# Patient Record
Sex: Male | Born: 1990 | Race: White | Hispanic: No | Marital: Married | Smoking: Never smoker
Health system: Southern US, Community
[De-identification: ages and names within clinical notes are randomized; demographics above are authoritative.]

## PROBLEM LIST (undated history)

## (undated) DIAGNOSIS — I10 Essential (primary) hypertension: Secondary | ICD-10-CM

## (undated) DIAGNOSIS — D689 Coagulation defect, unspecified: Secondary | ICD-10-CM

## (undated) DIAGNOSIS — L03119 Cellulitis of unspecified part of limb: Secondary | ICD-10-CM

## (undated) DIAGNOSIS — I2699 Other pulmonary embolism without acute cor pulmonale: Secondary | ICD-10-CM

## (undated) HISTORY — DX: Coagulation defect, unspecified: D68.9

## (undated) HISTORY — PX: MOUTH SURGERY: SHX715

## (undated) HISTORY — DX: Cellulitis of unspecified part of limb: L03.119

## (undated) HISTORY — DX: Essential (primary) hypertension: I10

## (undated) HISTORY — DX: Other pulmonary embolism without acute cor pulmonale: I26.99

---
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
## 2011-02-03 ENCOUNTER — Ambulatory Visit (INDEPENDENT_AMBULATORY_CARE_PROVIDER_SITE_OTHER): Payer: BC Managed Care – PPO | Admitting: Internal Medicine

## 2011-02-03 ENCOUNTER — Encounter: Payer: Self-pay | Admitting: Internal Medicine

## 2011-02-03 DIAGNOSIS — Z0000 Encounter for general adult medical examination without abnormal findings: Secondary | ICD-10-CM

## 2011-02-03 DIAGNOSIS — Z23 Encounter for immunization: Secondary | ICD-10-CM

## 2011-02-03 NOTE — Progress Notes (Signed)
  Subjective:    Patient ID: Daniel Sweeney, male    DOB: 1990-04-13, 20 y.o.   MRN: [id number]  HPI 20YO male presents for annual exam. No complaints today. Feeling well. Exercises regularly playing basketball. Eats a relatively healthy diet. Had PE last year, but completed 6 months coumadin, and has had no further leg pain/swelling/dyspnea.  No outpatient encounter prescriptions on file as of 02/03/2011.    Review of Systems  Constitutional: Negative for fever, chills, activity change, appetite change, fatigue and unexpected weight change.  Eyes: Negative for visual disturbance.  Respiratory: Negative for cough and shortness of breath.   Cardiovascular: Negative for chest pain, palpitations and leg swelling.  Gastrointestinal: Negative for abdominal pain and abdominal distention.  Genitourinary: Negative for dysuria, urgency and difficulty urinating.  Musculoskeletal: Negative for arthralgias and gait problem.  Skin: Negative for color change and rash.  Hematological: Negative for adenopathy.  Psychiatric/Behavioral: Negative for sleep disturbance and dysphoric mood. The patient is not nervous/anxious.    BP 122/70  Pulse 77  Temp(Src) 98 F (36.7 C) (Oral)  Ht 5\' 9"  (1.753 m)  Wt 171 lb (77.565 kg)  BMI 25.25 kg/m2  SpO2 97%     Objective:   Physical Exam  Constitutional: He is oriented to person, place, and time. He appears well-developed and well-nourished. No distress.  HENT:  Head: Normocephalic and atraumatic.  Right Ear: External ear normal.  Left Ear: External ear normal.  Nose: Nose normal.  Mouth/Throat: Oropharynx is clear and moist. No oropharyngeal exudate.  Eyes: Conjunctivae and EOM are normal. Pupils are equal, round, and reactive to light. Right eye exhibits no discharge. Left eye exhibits no discharge. No scleral icterus.  Neck: Normal range of motion. Neck supple. No tracheal deviation present. No thyromegaly present.  Cardiovascular: Normal rate, regular  rhythm and normal heart sounds.  Exam reveals no gallop and no friction rub.   No murmur heard. Pulmonary/Chest: Effort normal and breath sounds normal. No respiratory distress. He has no wheezes. He has no rales. He exhibits no tenderness.  Abdominal: Soft. Bowel sounds are normal. He exhibits no distension and no mass. There is no tenderness. There is no rebound and no guarding.  Musculoskeletal: Normal range of motion. He exhibits no edema.  Lymphadenopathy:    He has no cervical adenopathy.  Neurological: He is alert and oriented to person, place, and time. No cranial nerve deficit. Coordination normal.  Skin: Skin is warm and dry. No rash noted. He is not diaphoretic. No erythema. No pallor.  Psychiatric: He has a normal mood and affect. His behavior is normal. Judgment and thought content normal.          Assessment & Plan:  1. General exam - Doing well. Flu shot today. Encouraged healthy diet and regular exercise. Will request labs from last year, plan to repeat q2year and prn. Follow up 1 year.

## 2012-02-29 ENCOUNTER — Ambulatory Visit: Payer: BC Managed Care – PPO

## 2012-03-01 ENCOUNTER — Ambulatory Visit (INDEPENDENT_AMBULATORY_CARE_PROVIDER_SITE_OTHER): Payer: BC Managed Care – PPO

## 2012-03-01 DIAGNOSIS — Z23 Encounter for immunization: Secondary | ICD-10-CM

## 2012-04-24 IMAGING — CT CT CHEST W/ CM
1 series · 15 of 32 positions shown, 19 images · IV contrast (APPLIED)
Comparison: none

REASON FOR EXAM: Post-op, shortness of breath, elevated d-dimer, 98MWCW
COMMENTS:

PROCEDURE:     CT  - CT CHEST (FOR PE) W  - September 11, 2009  [DATE]
RESULT:     Chest CT dated 09/11/2009.
TECHNIQUE: Helical 3 mm sections obtained from the thoracic inlet through
the lung bases teslas intravenous administration of 100 mL of Isovue 370.

[Series 5: soft tissue · axial · 0.71mm/px · z∈[-340,-58]mm · 15 of 106 slices shown, 19 images]
[im 8/106  mediastinal]
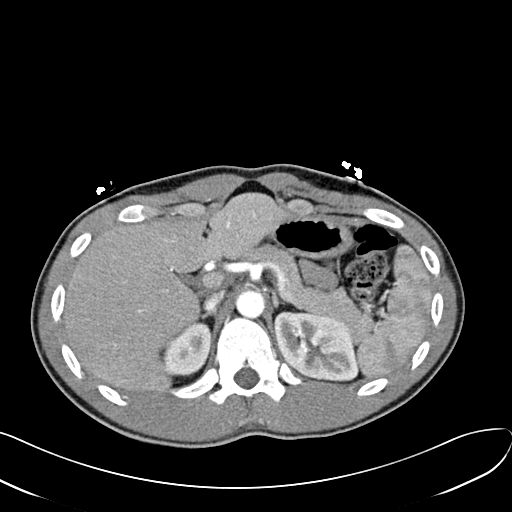
[im 8/106  lung]
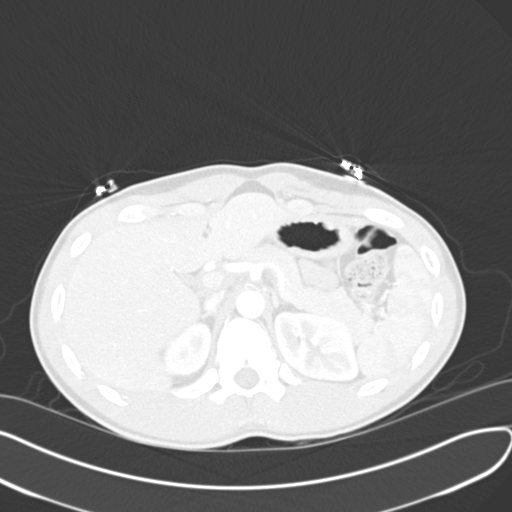
[im 16/106  lung]
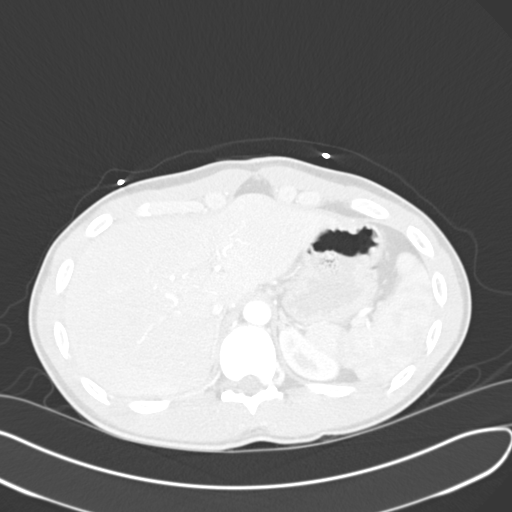
[im 22/106  lung]
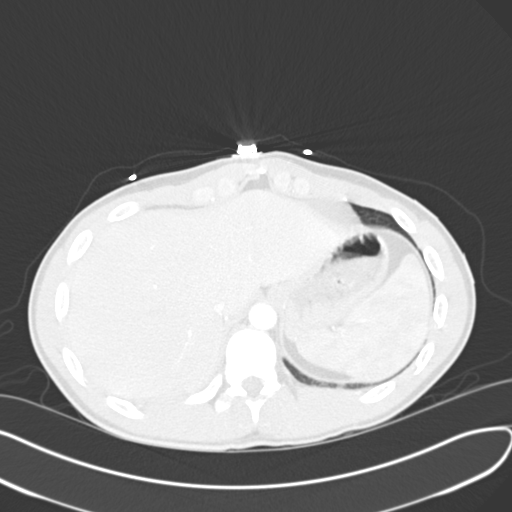
[im 28/106  lung]
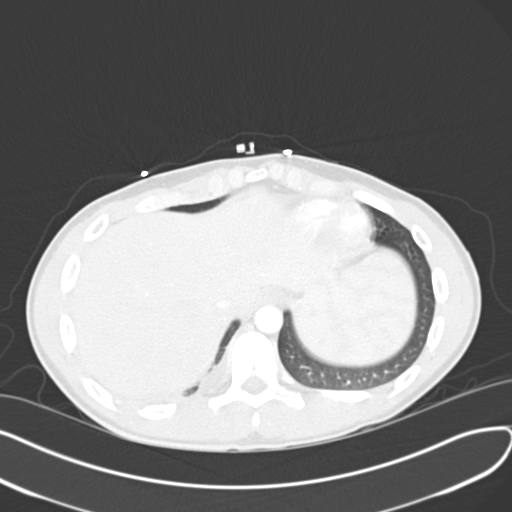
[im 36/106  mediastinal]
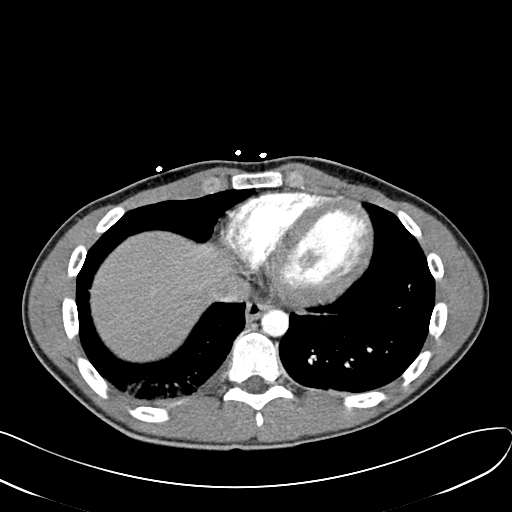
[im 36/106  lung]
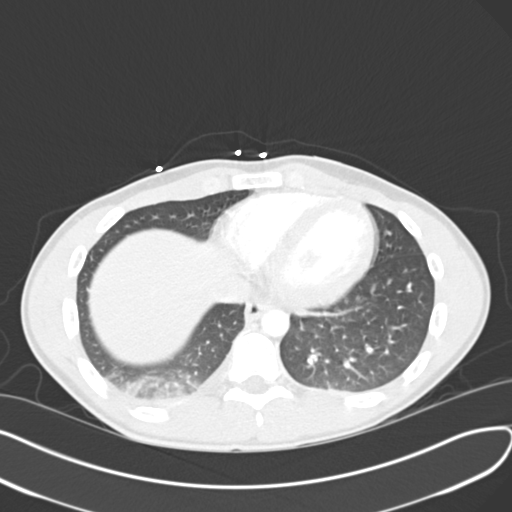
[im 43/106  lung]
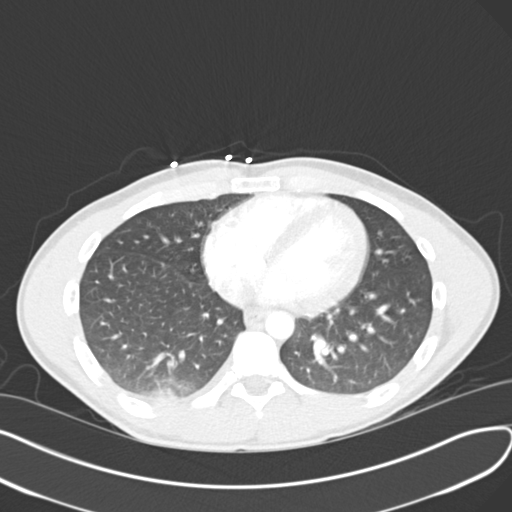
[im 51/106  lung]
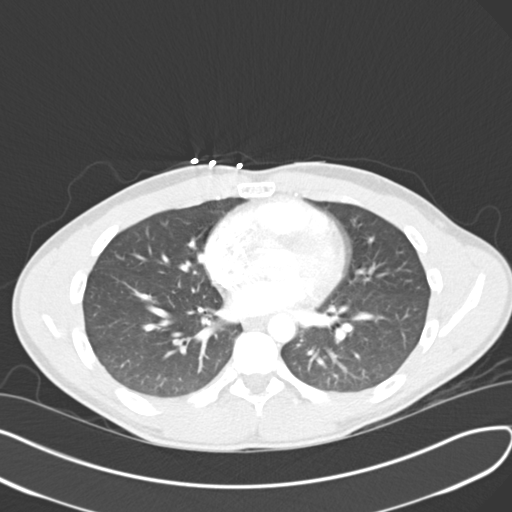
[im 56/106  lung]
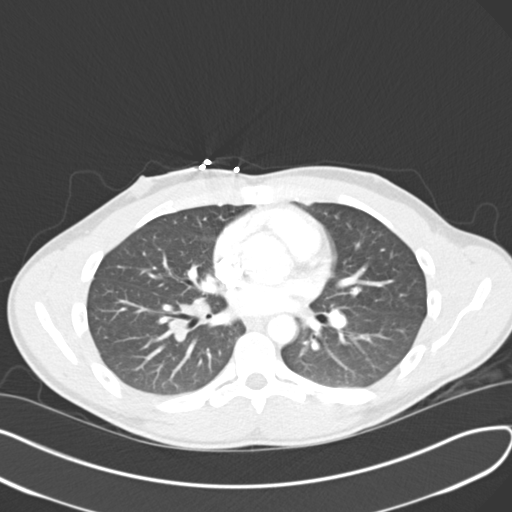
[im 63/106  mediastinal]
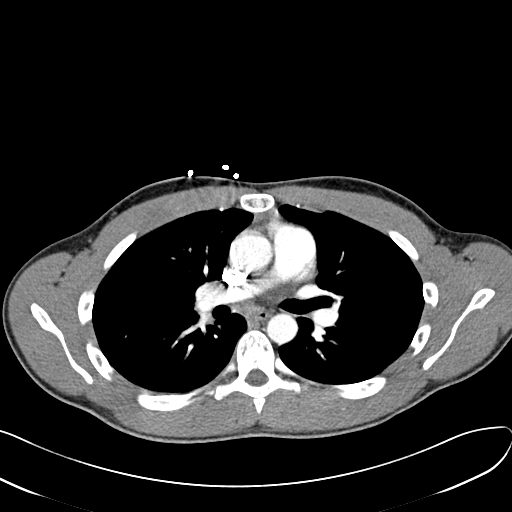
[im 63/106  lung]
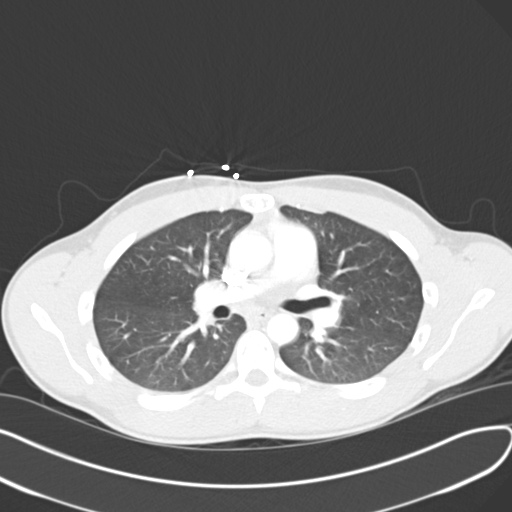
[im 67/106  lung]
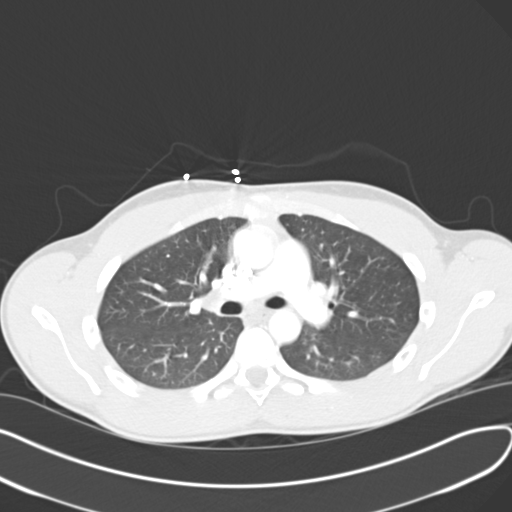
[im 74/106  lung]
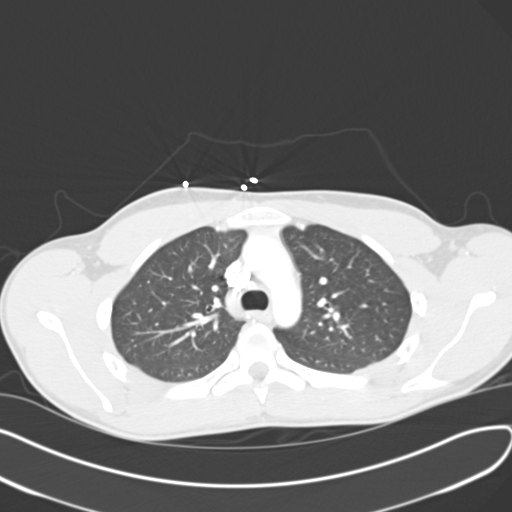
[im 82/106  lung]
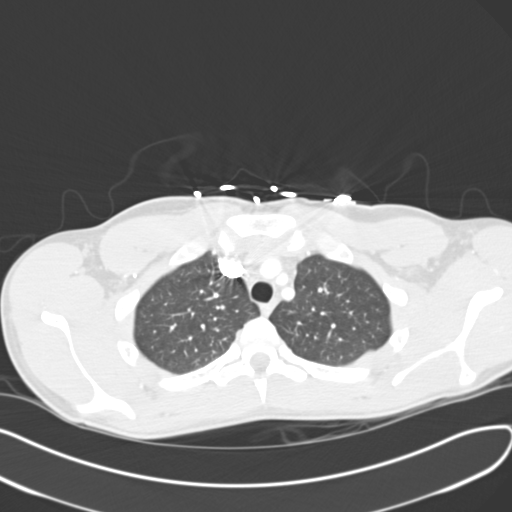
[im 86/106  mediastinal]
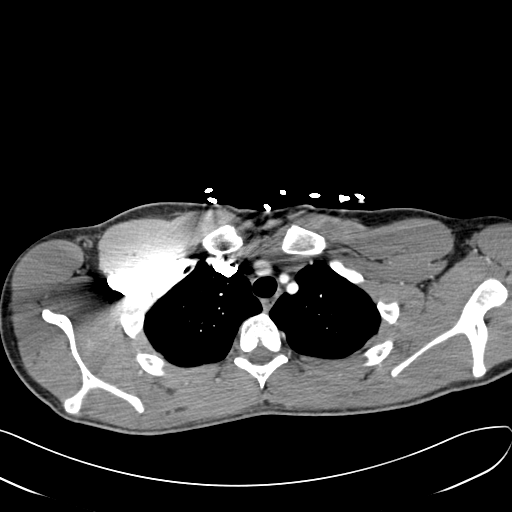
[im 86/106  lung]
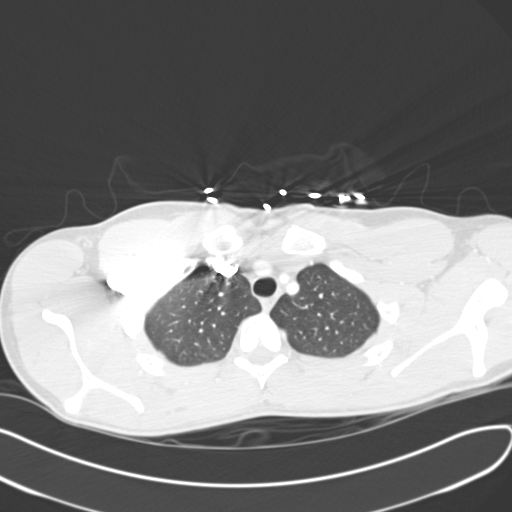
[im 94/106  lung]
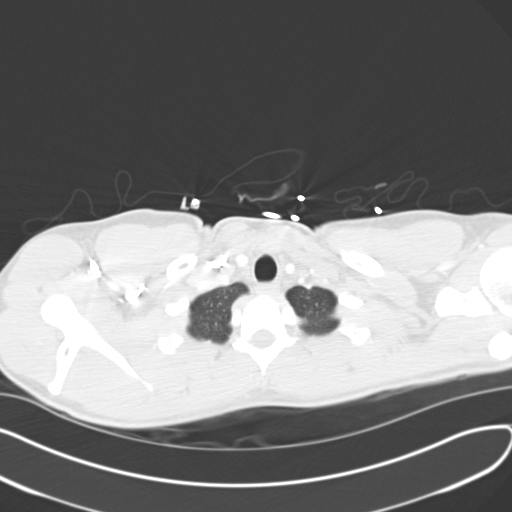
[im 102/106  lung]
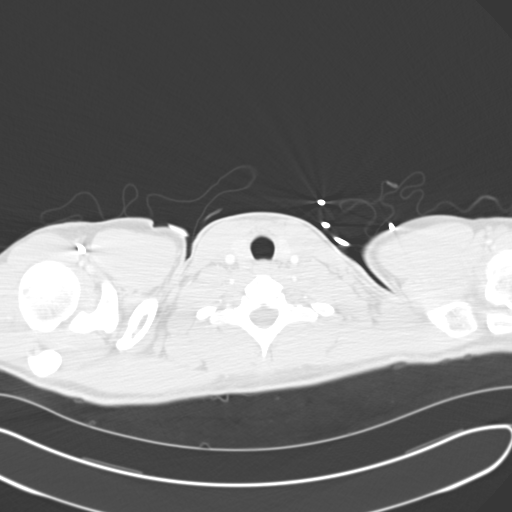

[15 of 32 positions shown; findings below may reference images not displayed]

FINDINGS: A filling defect is appreciated within the right lower lobe
pulmonary artery. There is extension into segmental branches of the
pulmonary arteries of the right lower lobe. A filling defect is also
appreciated a proximal  segmental branch of the right upper lobe pulmonary
artery. Subsegmental filling defects are identified within the basal
segments of the right lower lobe pulmonary arterial system. No further
filling defects are appreciated within the main, lobar or segmental
pulmonary arteries.

Evaluation of mediastinal or hilar regions and structures and trace no
evidence of mediastinal or hilar masses or adenopathy. The lung parenchyma
demonstrates an ill-defined area of pleural based increased density in the
right lung base. Differential considerations are atelectasis versus
infiltrate versus possibly a region of mild infarction. A trace right
pleural effusion is identified. The lung parenchyma otherwise demonstrates
no further regions of focal infiltrates effusions or edema. The visualized
upper abdominal viscera demonstrate no gross abnormalities.
IMPRESSION: CT findings consistent with pulmonary arterial embolic
disease involving the right lower lobe pulmonary artery as well as segmental
and subsegmental branches.
2. Dr. Jim of the emergency department was informed of these findings
of the time of the initial interpretation.

## 2012-08-28 IMAGING — US US EXTREM LOW VENOUS BILAT
1 series · 17 of 24 positions shown · non-contrast
Comparison: none

REASON FOR EXAM: CR  [id number] Yuli cellulitis rt thigh eval both legs
for DVT
COMMENTS:

[Series 1: us extrem low venous bilat · 17 of 47 slices shown]
[im 1/47]
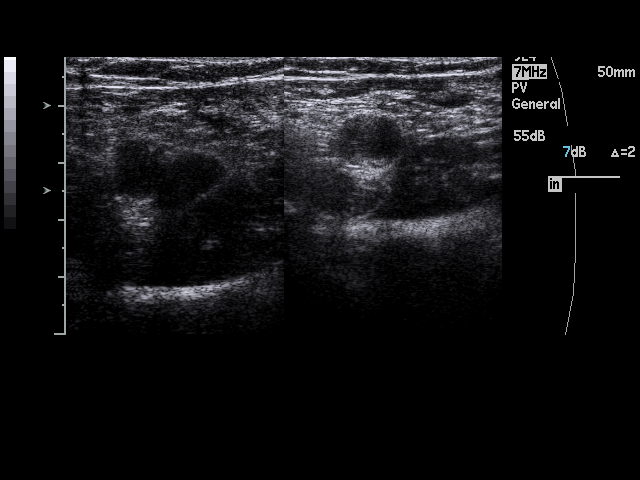
[im 5/47]
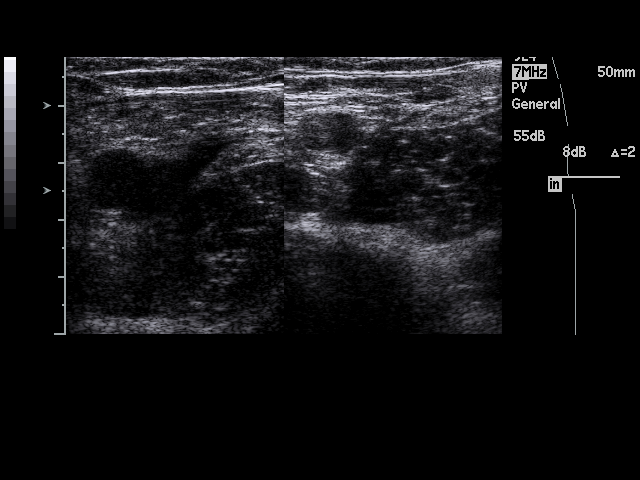
[im 7/47]
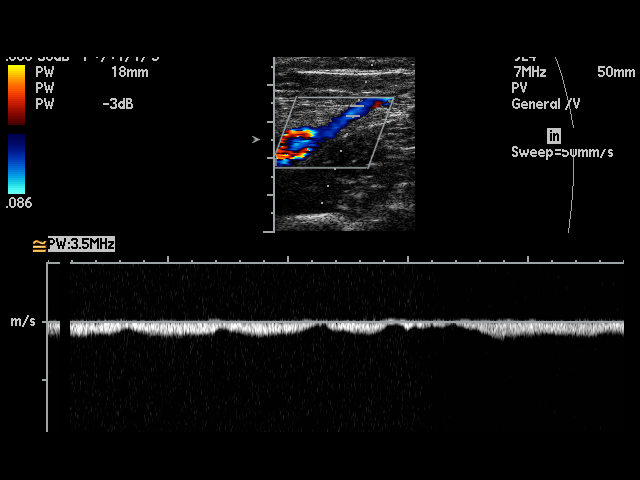
[im 9/47]
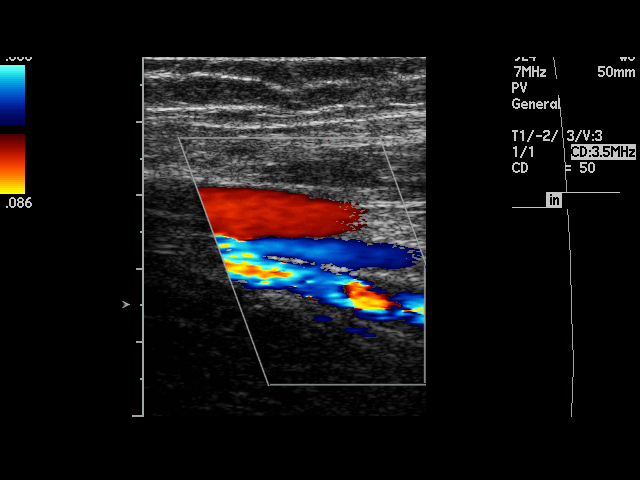
[im 13/47]
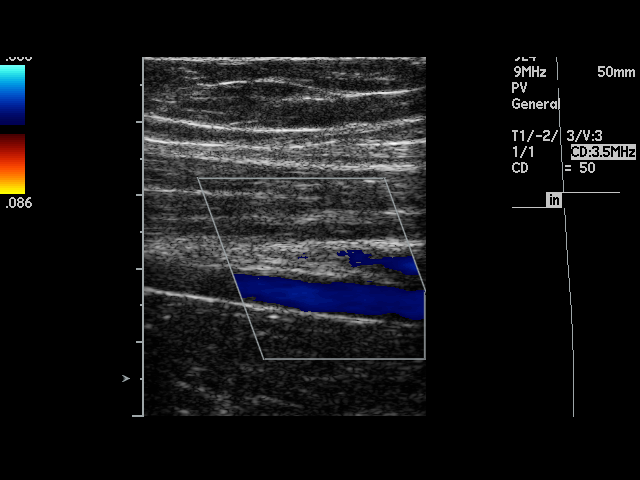
[im 15/47]
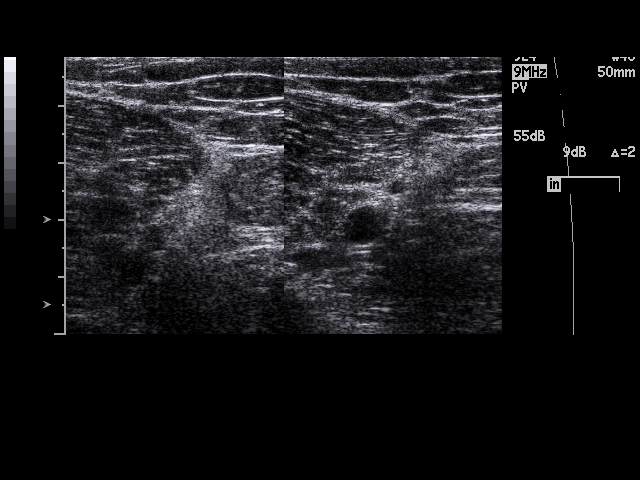
[im 19/47]
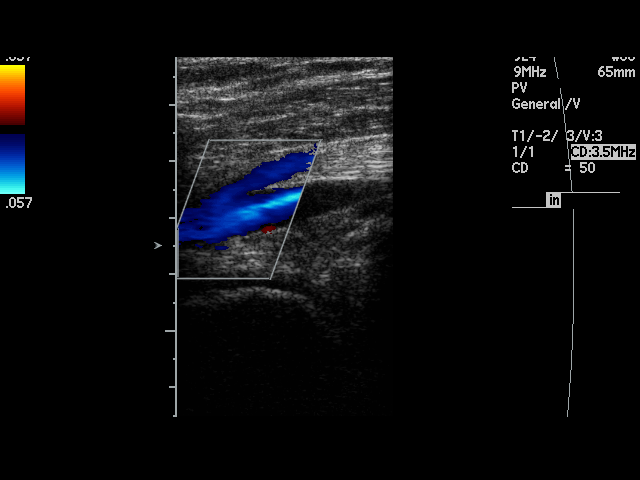
[im 21/47]
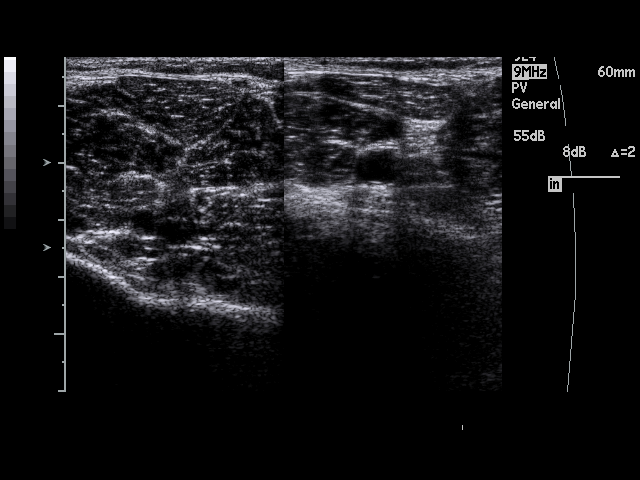
[im 25/47]
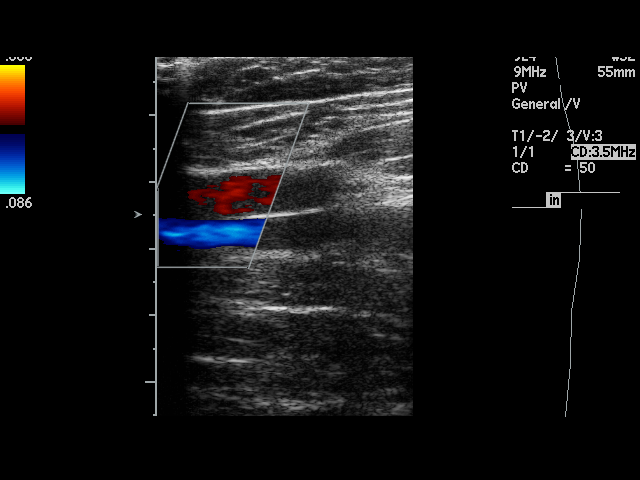
[im 27/47]
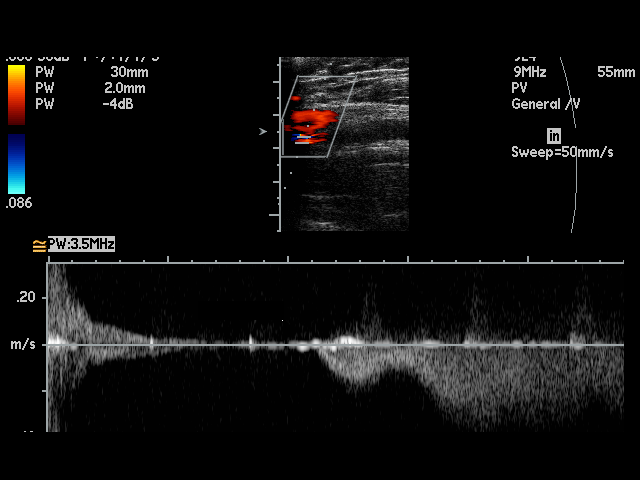
[im 29/47]
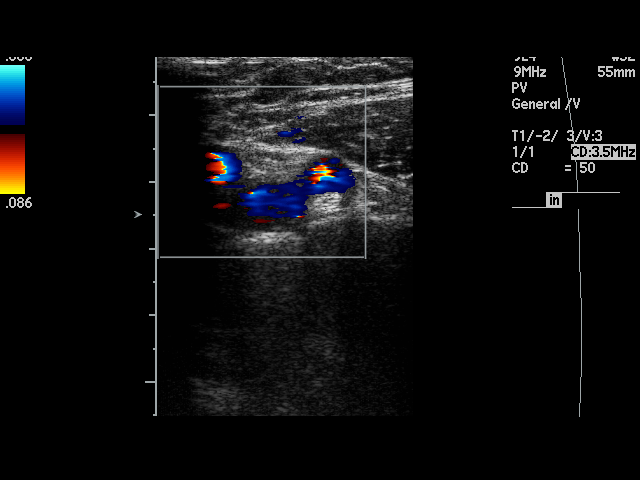
[im 33/47]
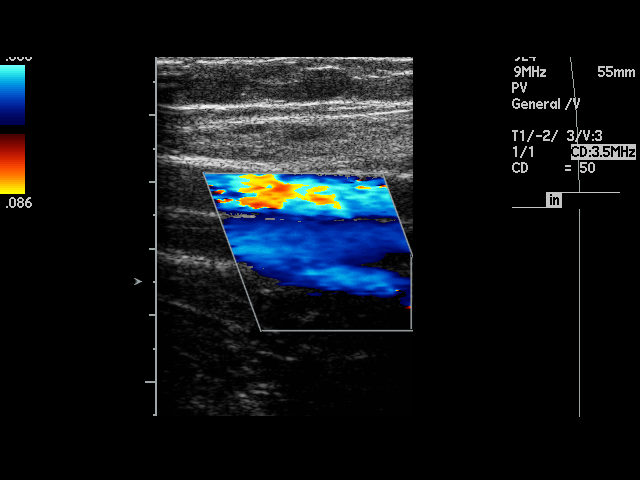
[im 35/47]
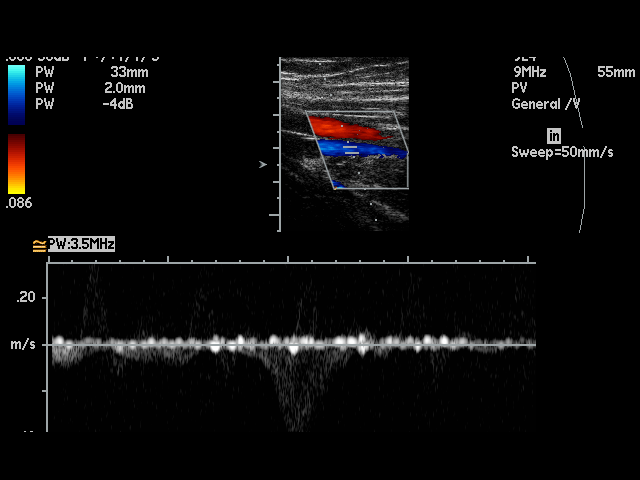
[im 39/47]
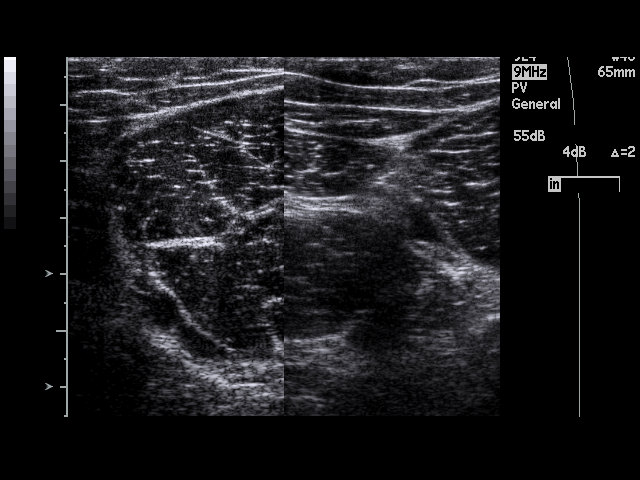
[im 41/47]
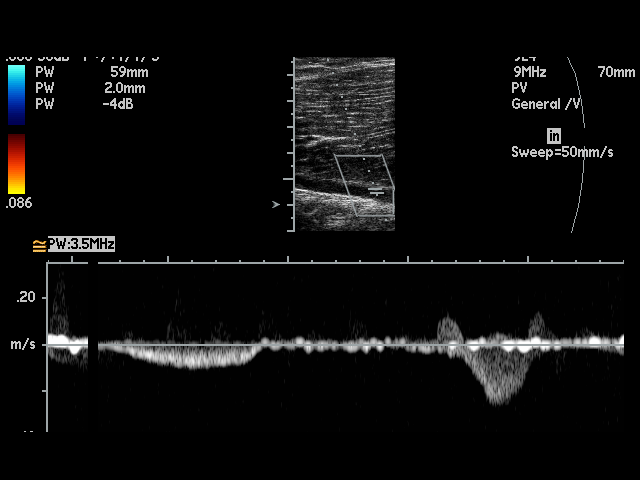
[im 43/47]
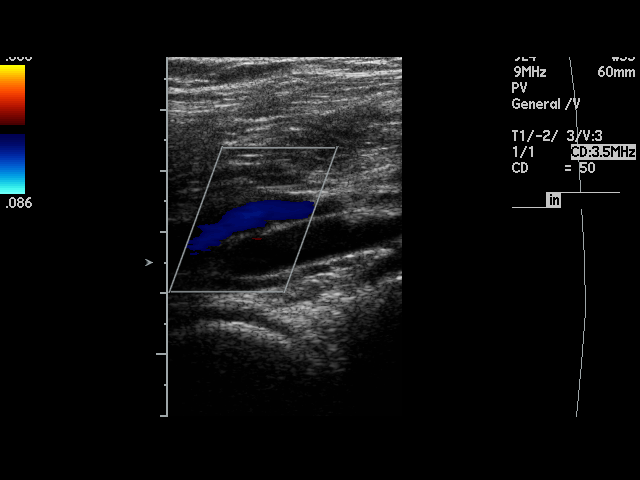
[im 47/47]
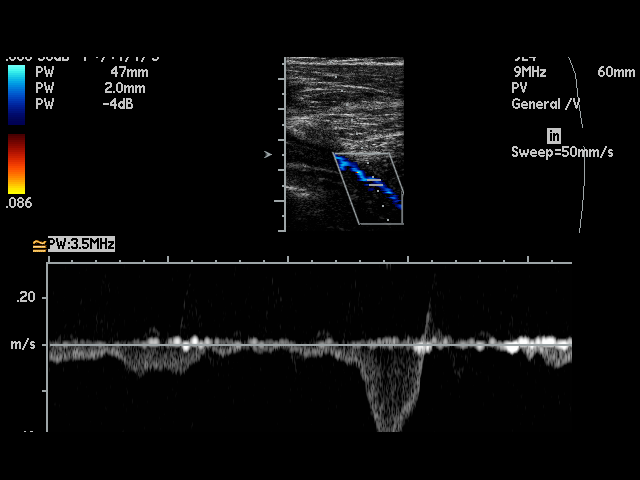

[17 of 24 positions shown; findings below may reference images not displayed]

PROCEDURE:     TARLA - TARLA DOPPLER VEINS BIL LEG EXTR  - January 15, 2010  [DATE]

RESULT:     The right and left femoral and popliteal veins are normally
compressible. The waveform patterns are normal and the color flow images are
normal. The response to the augmentation and Valsalva maneuvers is normal.
IMPRESSION: I do not see evidence of thrombosis of the right or left
femoral or popliteal veins.

A preliminary report was called the patient's caregiver at the conclusion of
the study.

## 2012-09-14 ENCOUNTER — Telehealth: Payer: Self-pay | Admitting: Internal Medicine

## 2012-09-14 NOTE — Telephone Encounter (Signed)
FYI to Dr. Walker 

## 2012-09-14 NOTE — Telephone Encounter (Signed)
Patient Information:  Caller Name: Daniel Sweeney  Phone: [phone]  Patient: Daniel Sweeney, Daniel Sweeney  Gender: Male  DOB: [birth year]  Age: 22 Years  PCP: Ronna Polio (Adults only)  Office Follow Up:  Does the office need to follow up with this patient?: No  Instructions For The Office: N/A  RN Note:  Instructed to monitor and call back if sx worsen or increase; will comply  Symptoms  Reason For Call & Symptoms: Dad is calling and states that child has yellow green discharge from left eye; sx started 09/11/12; sx today is only small amount of yellow discharged noted in cormer of eye upon awakening; no redness; no swelling; no irritation;  Reviewed Health History In EMR: Yes  Reviewed Medications In EMR: Yes  Reviewed Allergies In EMR: Yes  Reviewed Surgeries / Procedures: Yes  Date of Onset of Symptoms: 09/11/2012  Treatments Tried: allergy drops and warm compresses  Treatments Tried Worked: No  Guideline(s) Used:  Eye - Pus or Discharge  Disposition Per Guideline:   Home Care  Reason For Disposition Reached:   Very small amount of discharge and only in corner of eye  Advice Given:  Reassurance:  A small amount of pus or mucus in the inner corner of the eye is often due to a cold or virus. Sometimes it's just a reaction to an irritant in the eye.  Viral conjunctivitis (pink eye) does not need antibiotic treatment. It gets better by itself. Usually it's gone in 2 or 3 days.  Call Back If  Pus increases in amount  Pus in corner of eye lasts over 3 days  Eyelid becomes red or swollen  You become worse.  Patient Will Follow Care Advice:  YES

## 2013-04-24 ENCOUNTER — Ambulatory Visit: Payer: BC Managed Care – PPO | Admitting: Adult Health

## 2013-11-14 ENCOUNTER — Emergency Department: Payer: Self-pay | Admitting: Emergency Medicine

## 2013-11-19 ENCOUNTER — Other Ambulatory Visit: Payer: Self-pay | Admitting: Internal Medicine

## 2013-12-03 ENCOUNTER — Telehealth: Payer: Self-pay | Admitting: Internal Medicine

## 2013-12-03 ENCOUNTER — Ambulatory Visit (INDEPENDENT_AMBULATORY_CARE_PROVIDER_SITE_OTHER): Payer: BC Managed Care – PPO | Admitting: Internal Medicine

## 2013-12-03 ENCOUNTER — Encounter: Payer: Self-pay | Admitting: Internal Medicine

## 2013-12-03 VITALS — BP 144/88 | HR 112 | Temp 97.7°F | Ht 69.0 in | Wt 189.2 lb

## 2013-12-03 DIAGNOSIS — IMO0001 Reserved for inherently not codable concepts without codable children: Secondary | ICD-10-CM | POA: Insufficient documentation

## 2013-12-03 DIAGNOSIS — R030 Elevated blood-pressure reading, without diagnosis of hypertension: Secondary | ICD-10-CM

## 2013-12-03 DIAGNOSIS — S82009A Unspecified fracture of unspecified patella, initial encounter for closed fracture: Secondary | ICD-10-CM | POA: Insufficient documentation

## 2013-12-03 DIAGNOSIS — R000 Tachycardia, unspecified: Secondary | ICD-10-CM | POA: Insufficient documentation

## 2013-12-03 DIAGNOSIS — S82002S Unspecified fracture of left patella, sequela: Secondary | ICD-10-CM

## 2013-12-03 LAB — COMPREHENSIVE METABOLIC PANEL
ALT: 39 U/L (ref 0–53)
AST: 23 U/L (ref 0–37)
Albumin: 4 g/dL (ref 3.5–5.2)
Alkaline Phosphatase: 69 U/L (ref 39–117)
BUN: 17 mg/dL (ref 6–23)
CALCIUM: 9.9 mg/dL (ref 8.4–10.5)
CHLORIDE: 102 meq/L (ref 96–112)
CO2: 24 meq/L (ref 19–32)
CREATININE: 1.2 mg/dL (ref 0.4–1.5)
GFR: 82.14 mL/min (ref >60.00)
GLUCOSE: 84 mg/dL (ref 70–99)
Potassium: 4.4 mEq/L (ref 3.5–5.1)
Sodium: 138 mEq/L (ref 135–145)
Total Bilirubin: 0.7 mg/dL (ref 0.2–1.2)
Total Protein: 8.1 g/dL (ref 6.0–8.3)

## 2013-12-03 LAB — CBC WITH DIFFERENTIAL/PLATELET
BASOS PCT: 0.7 % (ref 0.0–3.0)
Basophils Absolute: 0.1 10*3/uL (ref 0.0–0.1)
EOS PCT: 3 % (ref 0.0–5.0)
Eosinophils Absolute: 0.3 10*3/uL (ref 0.0–0.7)
HCT: 49.6 % (ref 39.0–52.0)
Hemoglobin: 16.5 g/dL (ref 13.0–17.0)
LYMPHS PCT: 27.4 % (ref 12.0–46.0)
Lymphs Abs: 2.9 10*3/uL (ref 0.7–4.0)
MCHC: 33.3 g/dL (ref 30.0–36.0)
MCV: 87.8 fl (ref 78.0–100.0)
MONOS PCT: 5.8 % (ref 3.0–12.0)
Monocytes Absolute: 0.6 10*3/uL (ref 0.1–1.0)
NEUTROS PCT: 63.1 % (ref 43.0–77.0)
Neutro Abs: 6.7 10*3/uL (ref 1.4–7.7)
Platelets: 324 10*3/uL (ref 150.0–400.0)
RBC: 5.65 Mil/uL (ref 4.22–5.81)
RDW: 12.8 % (ref 11.5–15.5)
WBC: 10.6 10*3/uL — AB (ref 4.0–10.5)

## 2013-12-03 LAB — TSH: TSH: 1.53 u[IU]/mL (ref 0.35–4.50)

## 2013-12-03 NOTE — Assessment & Plan Note (Signed)
BP Readings from Last 3 Encounters:  12/03/13 144/88  02/03/11 122/70   BP elevated today and at last few visits at [facility]. Will check CMP, CBC, TSH. EKG today suggests LVH. Will get cardiac ECHO. Will hold off on adding medication for now. Recheck BP in 1 week.

## 2013-12-03 NOTE — Telephone Encounter (Signed)
Pt needs 1wk  Recheck 30 mins. Please advise where to add to schedule.msn

## 2013-12-03 NOTE — Patient Instructions (Signed)
Labs today. EKG today.  We will schedule cardiac ECHO.  Follow up in 1-2 weeks.

## 2013-12-03 NOTE — Assessment & Plan Note (Signed)
Tachycardia noted on exam today. EKG sinus tach. Denies pain. S/p recent repair of patellar fracture. On anticoagulation with Xarelto and Lovenox prior to that. Will check CMP, CBC, TSH with labs.

## 2013-12-03 NOTE — Telephone Encounter (Signed)
4pm Tues Nov 3rd 30min

## 2013-12-03 NOTE — Telephone Encounter (Signed)
Please advise 

## 2013-12-03 NOTE — Progress Notes (Signed)
Pre visit review using our clinic review tool, if applicable. No additional management support is needed unless otherwise documented below in the visit note. 

## 2013-12-03 NOTE — Assessment & Plan Note (Addendum)
Continue follow up as scheduled tomorrow at [facility]. Pt denies any pain symptoms.

## 2013-12-03 NOTE — Progress Notes (Signed)
Subjective:    Patient ID: Daniel Sweeney, male    DOB: 04/23/1990, 23 y.o.   MRN: [id number]  HPI 22YO male presents for follow up.  [facility] hospitalized at [facility]ellar fracture. Had surgery to repair patella 2 weeks ago. Started on Xarelto because of previous history of PE.  BP was noted to be elevated during recent admission, as high as 172/100. On recheck at [facility] BP was elevated 149/103. Has follow [facility] at [facility]. No dyspnea, chest pain. Occasional headache last week. Improved with Tylenol. Denies any pain.   Review of Systems  Constitutional: Negative for fever, chills, activity change, appetite change, fatigue and unexpected weight change.  Eyes: Negative for visual disturbance.  Respiratory: Positive for shortness of breath (only with exertion). Negative for cough and wheezing.   Cardiovascular: Negative for chest pain, palpitations and leg swelling.  Gastrointestinal: Negative for abdominal pain and abdominal distention.  Genitourinary: Negative for dysuria, urgency and difficulty urinating.  Musculoskeletal: Negative for arthralgias and gait problem.  Skin: Negative for color change and rash.  Neurological: Positive for headaches.  Hematological: Negative for adenopathy.  Psychiatric/Behavioral: Negative for sleep disturbance and dysphoric mood. The patient is not nervous/anxious.        Objective:    BP 144/88  Pulse 112  Temp(Src) 97.7 F (36.5 C) (Oral)  Ht 5\' 9"  (1.753 m)  Wt 189 lb 4 oz (85.843 kg)  BMI 27.93 kg/m2  SpO2 98% Physical Exam  Constitutional: He is oriented to person, place, and time. He appears well-developed and well-nourished. No distress.  HENT:  Head: Normocephalic and atraumatic.  Right Ear: External ear normal.  Left Ear: External ear normal.  Nose: Nose normal.  Mouth/Throat: Oropharynx is clear and moist.  Eyes: Conjunctivae and EOM are normal. Pupils are equal, round, and reactive to light. Right eye  exhibits no discharge. Left eye exhibits no discharge. No scleral icterus.  Neck: Normal range of motion. Neck supple. No tracheal deviation present. No thyromegaly present.  Cardiovascular: Normal rate, regular rhythm and normal heart sounds.  Exam reveals no gallop and no friction rub.   No murmur heard. Pulmonary/Chest: Effort normal and breath sounds normal. No accessory muscle usage. Not tachypneic. No respiratory distress. He has no decreased breath sounds. He has no wheezes. He has no rhonchi. He has no rales. He exhibits no tenderness.  Musculoskeletal: Normal range of motion. He exhibits no edema.       Legs: Lymphadenopathy:    He has no cervical adenopathy.  Neurological: He is alert and oriented to person, place, and time. No cranial nerve deficit. Coordination normal.  Skin: Skin is warm and dry. No rash noted. He is not diaphoretic. No erythema. No pallor.  Psychiatric: He has a normal mood and affect. His behavior is normal. Judgment and thought content normal.          Assessment & Plan:   Problem List Items Addressed This Visit     Unprioritized   Elevated BP - Primary      BP Readings from Last 3 Encounters:  12/03/13 144/88  02/03/11 122/70   BP elevated today and at [facility]. Will check CMP, CBC, TSH. EKG today suggests LVH. Will get cardiac ECHO. Will hold off on adding medication for now. Recheck BP in 1 week.    Relevant Orders      Comprehensive metabolic panel      TSH      CBC w/Diff  2D Echocardiogram without contrast      EKG 12-Lead (Completed)   Patellar fracture     [facility]. Pt denies any pain symptoms.    Tachycardia     Tachycardia noted on exam today. EKG sinus tach. Denies pain. S/p recent repair of patellar fracture. On anticoagulation with Xarelto and Lovenox prior to that. Will check CMP, CBC, TSH with labs.        Return in about 1 week (around 12/10/2013) for  Recheck.

## 2013-12-04 ENCOUNTER — Other Ambulatory Visit (HOSPITAL_COMMUNITY): Payer: Self-pay | Admitting: Internal Medicine

## 2013-12-04 ENCOUNTER — Ambulatory Visit (HOSPITAL_COMMUNITY): Payer: BC Managed Care – PPO | Attending: Internal Medicine | Admitting: Radiology

## 2013-12-04 ENCOUNTER — Encounter: Payer: Self-pay | Admitting: Internal Medicine

## 2013-12-04 ENCOUNTER — Telehealth: Payer: Self-pay | Admitting: Internal Medicine

## 2013-12-04 DIAGNOSIS — R030 Elevated blood-pressure reading, without diagnosis of hypertension: Principal | ICD-10-CM

## 2013-12-04 DIAGNOSIS — R000 Tachycardia, unspecified: Secondary | ICD-10-CM | POA: Diagnosis not present

## 2013-12-04 DIAGNOSIS — IMO0001 Reserved for inherently not codable concepts without codable children: Secondary | ICD-10-CM

## 2013-12-04 NOTE — Telephone Encounter (Signed)
LMTCB for appt details/msn °

## 2013-12-04 NOTE — Progress Notes (Signed)
Echocardiogram performed.  

## 2013-12-17 ENCOUNTER — Encounter: Payer: Self-pay | Admitting: Internal Medicine

## 2013-12-17 ENCOUNTER — Ambulatory Visit (INDEPENDENT_AMBULATORY_CARE_PROVIDER_SITE_OTHER): Payer: BC Managed Care – PPO | Admitting: *Deleted

## 2013-12-17 ENCOUNTER — Ambulatory Visit (INDEPENDENT_AMBULATORY_CARE_PROVIDER_SITE_OTHER): Payer: BC Managed Care – PPO | Admitting: Internal Medicine

## 2013-12-17 VITALS — BP 116/92 | HR 98 | Temp 98.2°F | Ht 69.0 in | Wt 193.5 lb

## 2013-12-17 DIAGNOSIS — I10 Essential (primary) hypertension: Secondary | ICD-10-CM

## 2013-12-17 DIAGNOSIS — Z23 Encounter for immunization: Secondary | ICD-10-CM

## 2013-12-17 NOTE — Progress Notes (Signed)
Pre visit review using our clinic review tool, if applicable. No additional management support is needed unless otherwise documented below in the visit note. 

## 2013-12-17 NOTE — Progress Notes (Signed)
   Subjective:    Patient ID: Daniel ShropshireJoshua Sweeney, male    DOB: 02/19/1990, 23 y.o.   MRN: [id number]  HPI 23YO male presents for follow up.  S/p left patellar fracture and surgical repair. Continues to wear left leg brace. Continues on Xarelto.  BP have been typically near 130-150/80-90 when checked at home. He denies headache, chest pain, dyspnea, palpitations. Recent ECHO was normal.   Review of Systems  Constitutional: Negative for fever, chills, activity change, appetite change, fatigue and unexpected weight change.  Eyes: Negative for visual disturbance.  Respiratory: Negative for cough and shortness of breath.   Cardiovascular: Negative for chest pain, palpitations and leg swelling.  Gastrointestinal: Negative for abdominal pain and abdominal distention.  Genitourinary: Negative for dysuria, urgency and difficulty urinating.  Musculoskeletal: Negative for arthralgias and gait problem.  Skin: Negative for color change and rash.  Hematological: Negative for adenopathy.  Psychiatric/Behavioral: Negative for sleep disturbance and dysphoric mood. The patient is not nervous/anxious.        Objective:    BP 116/92 mmHg  Pulse 98  Temp(Src) 98.2 F (36.8 C) (Oral)  Ht 5\' 9"  (1.753 m)  Wt 193 lb 8 oz (87.771 kg)  BMI 28.56 kg/m2  SpO2 97% Physical Exam  Constitutional: He is oriented to person, place, and time. He appears well-developed and well-nourished. No distress.  HENT:  Head: Normocephalic and atraumatic.  Right Ear: External ear normal.  Left Ear: External ear normal.  Nose: Nose normal.  Mouth/Throat: Oropharynx is clear and moist. No oropharyngeal exudate.  Eyes: Conjunctivae and EOM are normal. Pupils are equal, round, and reactive to light. Right eye exhibits no discharge. Left eye exhibits no discharge. No scleral icterus.  Neck: Normal range of motion. Neck supple. No tracheal deviation present. No thyromegaly present.  Cardiovascular: Normal rate, regular rhythm  and normal heart sounds.  Exam reveals no gallop and no friction rub.   No murmur heard. Pulmonary/Chest: Effort normal and breath sounds normal. No accessory muscle usage. No tachypnea. No respiratory distress. He has no decreased breath sounds. He has no wheezes. He has no rhonchi. He has no rales. He exhibits no tenderness.  Musculoskeletal: Normal range of motion. He exhibits no edema.       Legs: Lymphadenopathy:    He has no cervical adenopathy.  Neurological: He is alert and oriented to person, place, and time. No cranial nerve deficit. Coordination normal.  Skin: Skin is warm and dry. No rash noted. He is not diaphoretic. No erythema. No pallor.  Psychiatric: He has a normal mood and affect. His behavior is normal. Judgment and thought content normal.          Assessment & Plan:   Problem List Items Addressed This Visit      Unprioritized   Essential hypertension - Primary    BP Readings from Last 3 Encounters:  12/17/13 116/92  12/03/13 144/88  02/03/11 122/70   BP has been elevated after recent left leg injury. Recent labs including renal function, thyroid function were normal and cardiac ECHO was normal. Will set up cardiology evaluation. Question if renal artery dopplers might be warranted for further evaluation.     Relevant Orders      Ambulatory referral to Cardiology       Return in about 4 weeks (around 01/14/2014) for Recheck.

## 2013-12-17 NOTE — Assessment & Plan Note (Signed)
BP Readings from Last 3 Encounters:  12/17/13 116/92  12/03/13 144/88  02/03/11 122/70   BP has been elevated after recent left leg injury. Recent labs including renal function, thyroid function were normal and cardiac ECHO was normal. Will set up cardiology evaluation. Question if renal artery dopplers might be warranted for further evaluation.

## 2013-12-17 NOTE — Patient Instructions (Signed)
We will set up an evaluation with Cardiology.

## 2013-12-18 ENCOUNTER — Telehealth: Payer: Self-pay | Admitting: Internal Medicine

## 2013-12-18 NOTE — Telephone Encounter (Signed)
emmi emailed °

## 2013-12-24 ENCOUNTER — Encounter: Payer: Self-pay | Admitting: Cardiovascular Disease

## 2013-12-24 ENCOUNTER — Ambulatory Visit (INDEPENDENT_AMBULATORY_CARE_PROVIDER_SITE_OTHER): Payer: BC Managed Care – PPO | Admitting: Cardiovascular Disease

## 2013-12-24 VITALS — BP 130/98 | HR 93 | Ht 70.0 in | Wt 189.0 lb

## 2013-12-24 DIAGNOSIS — R000 Tachycardia, unspecified: Secondary | ICD-10-CM

## 2013-12-24 DIAGNOSIS — I10 Essential (primary) hypertension: Secondary | ICD-10-CM

## 2013-12-24 NOTE — Patient Instructions (Signed)
Your physician has requested that you have a renal artery duplex. During this test, an ultrasound is used to evaluate blood flow to the kidneys. Allow one hour for this exam. Do not eat after midnight the day before and avoid carbonated beverages. Take your medications as you usually do.  Your physician recommends that you return for lab work in:  Tomorrow morning or a morning next week  Cortisol and Aldosterone renin ratio   Your physician recommends that you schedule a follow-up appointment in:  As needed   Your next appointment will be scheduled in our new office located at :  [facility]  [address], [address]  [place], [place] [address]

## 2013-12-24 NOTE — Progress Notes (Signed)
Primary care physician: Dr. Dan HumphreysWalker  HPI  This is a pleasant 23 year old man who was referred for evaluation of hypertension. He has no previous cardiac history for congenital heart disease. He did have an injury to his right knee last year which required surgery. It was complicated by right sided DVT. He underwent workup for hypercoagulable state which was negative. He had a recent injury to his left knee which required surgery. He was placed on anticoagulation briefly. Since the injury, he has noted elevated blood pressure without any symptoms. He denies any headache, dizziness, chest pain or shortness of breath. No palpitations. He is currently not taking any medications or supplements. He does not smoke or drink excessive amount of alcohol. He had routine labs recently which were overall unremarkable including TSH. Echocardiogram showed normal LV systolic function but there was evidence of moderate left ventricular hypertrophy.  No Known Allergies   No current outpatient prescriptions on file prior to visit.   No current facility-administered medications on file prior to visit.     Past Medical History  Diagnosis Date  . Pulmonary embolism     in setting of knee surgery, s/p 6 months of coumadin  . Cellulitis of thigh   . Clotting disorder     Hx right lung  . Hypertension      Past Surgical History  Procedure Laterality Date  . Patella fracture surgery  08/2009     Family History  Problem Relation Age of Onset  . Hyperlipidemia Mother   . Hypertension Mother      History   Social History  . Marital Status: Single    Spouse Name: N/A    Number of Children: N/A  . Years of Education: N/A   Occupational History  . Not on file.   Social History Main Topics  . Smoking status: Never Smoker   . Smokeless tobacco: Never Used  . Alcohol Use: No  . Drug Use: No  . Sexual Activity: Not on file   Other Topics Concern  . Not on file   Social History Narrative   Regular Exercise -  Basketball, gym - 2 - 3 times a week   Daily Caffeine Use:  1 soda daily     ROS A 10 point review of system was performed. It is negative other than that mentioned in the history of present illness.   PHYSICAL EXAM   BP 130/98 mmHg  Pulse 93  Ht 5\' 10"  (1.778 m)  Wt 189 lb (85.73 kg)  BMI 27.12 kg/m2 Constitutional: He is oriented to person, place, and time. He appears well-developed and well-nourished. No distress.  HENT: No nasal discharge.  Head: Normocephalic and atraumatic.  Eyes: Pupils are equal and round.  No discharge. Neck: Normal range of motion. Neck supple. No JVD present. No thyromegaly present.  Cardiovascular: Normal rate, regular rhythm, normal heart sounds. Exam reveals no gallop and no friction rub. No murmur heard.  Pulmonary/Chest: Effort normal and breath sounds normal. No stridor. No respiratory distress. He has no wheezes. He has no rales. He exhibits no tenderness.  Abdominal: Soft. Bowel sounds are normal. He exhibits no distension. There is no tenderness. There is no rebound and no guarding. No abdominal bruits Musculoskeletal: Normal range of motion. He exhibits no edema and no tenderness.  Neurological: He is alert and oriented to person, place, and time. Coordination normal.  Skin: Skin is warm and dry. No rash noted. He is not diaphoretic. No erythema. No pallor.  Psychiatric:  He has a normal mood and affect. His behavior is normal. Judgment and thought content normal.       ZOX:WRUEAEKG:Sinus  Rhythm  Voltage criteria for LVH  (R(aVF) exceeds 2.00 mV).   -  Nonspecific T-abnormality.   ABNORMAL    ASSESSMENT AND PLAN

## 2013-12-24 NOTE — Assessment & Plan Note (Addendum)
He does have family history of hypertension but not at this age. Given his young age, we definitely have to exclude secondary hypertension. There is no evidence of coarctation by physical exam. We have to exclude renal artery stenosis due to fibromuscular dysplasia. I requested a renal artery duplex. We also have to evaluate for endocrine issues. I requested a.m. Cortisol level as well as aldosterone/renin ratio. He has no symptoms suggestive of pheochromocytoma.

## 2013-12-25 ENCOUNTER — Other Ambulatory Visit: Payer: BC Managed Care – PPO

## 2013-12-25 DIAGNOSIS — I10 Essential (primary) hypertension: Secondary | ICD-10-CM

## 2013-12-31 ENCOUNTER — Telehealth: Payer: Self-pay | Admitting: Cardiovascular Disease

## 2013-12-31 NOTE — Telephone Encounter (Signed)
Mother calling about results on lab. Please call.

## 2014-01-01 ENCOUNTER — Encounter (INDEPENDENT_AMBULATORY_CARE_PROVIDER_SITE_OTHER): Payer: BC Managed Care – PPO

## 2014-01-01 ENCOUNTER — Telehealth: Payer: Self-pay

## 2014-01-01 DIAGNOSIS — I10 Essential (primary) hypertension: Secondary | ICD-10-CM

## 2014-01-01 DIAGNOSIS — R000 Tachycardia, unspecified: Secondary | ICD-10-CM

## 2014-01-01 NOTE — Telephone Encounter (Signed)
Referral called to [facility]  Notes faxed as requested

## 2014-01-01 NOTE — Telephone Encounter (Signed)
Pt would like lab results.  

## 2014-01-01 NOTE — Telephone Encounter (Signed)
See result note.  

## 2014-01-02 ENCOUNTER — Telehealth: Payer: Self-pay

## 2014-01-02 NOTE — Telephone Encounter (Signed)
Pt mother called, states because he can not drive, pt does not want to go to endo in [place], they want to keep his endo local. States she will call and set up the appt.

## 2014-01-02 NOTE — Telephone Encounter (Signed)
Pt mother called asking us to send a referral [facility]. They need pt ov, labs and any tests he has done with us. Please fax over the referral to [phone]. [facility] to [facility]. Please call mother if you have any questions.

## 2014-01-02 NOTE — Telephone Encounter (Signed)
Referral faxed to [facility] as requested

## 2014-01-08 ENCOUNTER — Telehealth: Payer: Self-pay | Admitting: Cardiovascular Disease

## 2014-01-08 NOTE — Telephone Encounter (Signed)
Pt mother called asking us to send a referral to [facility]. They never got the last fax we sent over. They need pt ov, labs and any tests he has done with us. Please fax over the referral to [phone]. [facility] to [facility]. Please call mother if you have any questions. she is trying to do this before he starts school again.

## 2014-01-08 NOTE — Telephone Encounter (Signed)
Requested info faxed

## 2014-01-16 ENCOUNTER — Telehealth: Payer: Self-pay | Admitting: *Deleted

## 2014-01-16 NOTE — Telephone Encounter (Signed)
Error

## 2014-01-16 NOTE — Telephone Encounter (Signed)
-----   Message from Holley Dexterracy Tekely sent at 01/16/2014  3:51 PM EST ----- Regarding: referral [id number] - Ambulatory referral to Endocrinology. Has this been done?

## 2014-01-31 ENCOUNTER — Encounter: Payer: Self-pay | Admitting: Internal Medicine

## 2014-01-31 ENCOUNTER — Ambulatory Visit (INDEPENDENT_AMBULATORY_CARE_PROVIDER_SITE_OTHER): Payer: BC Managed Care – PPO | Admitting: Internal Medicine

## 2014-01-31 VITALS — BP 150/88 | HR 101 | Temp 98.1°F | Ht 69.0 in | Wt 196.8 lb

## 2014-01-31 DIAGNOSIS — I10 Essential (primary) hypertension: Secondary | ICD-10-CM

## 2014-01-31 MED ORDER — CARVEDILOL 3.125 MG PO TABS: 3.1250 mg | ORAL_TABLET | Freq: Two times a day (BID) | ORAL | Status: DC

## 2014-01-31 NOTE — Assessment & Plan Note (Signed)
Evaluation to date including endocrine evaluation, ECHO and renal artery doppler all normal. BP persistently elevated. Will start Carvedilol 3.125mg  bid. Discussed pros and cons of this medication. He will email with BP readings. Follow up in 4 weeks or sooner as needed.

## 2014-01-31 NOTE — Progress Notes (Signed)
Pre visit review using our clinic review tool, if applicable. No additional management support is needed unless otherwise documented below in the visit note. 

## 2014-01-31 NOTE — Progress Notes (Signed)
Subjective:    Patient ID: Daniel Sweeney, male    DOB: 10/20/1990, 23 y.o.   MRN: [id number]  HPI 23YO male presents for follow up.  Evaluation for hypertension including ECHO, renal artery doppler and endocrine evaluation have been all normal. No chest pain, headache, dyspnea, palpitations. Feeling well.  BP Readings from Last 3 Encounters:  01/31/14 150/88  12/24/13 130/98  12/17/13 116/92    Wt Readings from Last 3 Encounters:  01/31/14 196 lb 12 oz (89.245 kg)  12/24/13 189 lb (85.73 kg)  12/17/13 193 lb 8 oz (87.771 kg)      Past medical, surgical, family and social history per today's encounter.  Review of Systems  Constitutional: Negative for fever, chills, activity change, appetite change, fatigue and unexpected weight change.  Eyes: Negative for visual disturbance.  Respiratory: Negative for cough and shortness of breath.   Cardiovascular: Negative for chest pain, palpitations and leg swelling.  Gastrointestinal: Negative for abdominal pain and abdominal distention.  Genitourinary: Negative for dysuria, urgency and difficulty urinating.  Musculoskeletal: Negative for arthralgias and gait problem.  Skin: Negative for color change and rash.  Neurological: Negative for headaches.  Hematological: Negative for adenopathy.  Psychiatric/Behavioral: Negative for sleep disturbance and dysphoric mood. The patient is not nervous/anxious.        Objective:    BP 150/88 mmHg  Pulse 101  Temp(Src) 98.1 F (36.7 C) (Oral)  Ht 5\' 9"  (1.753 m)  Wt 196 lb 12 oz (89.245 kg)  BMI 29.04 kg/m2  SpO2 100% Physical Exam  Constitutional: He is oriented to person, place, and time. He appears well-developed and well-nourished. No distress.  HENT:  Head: Normocephalic and atraumatic.  Right Ear: External ear normal.  Left Ear: External ear normal.  Nose: Nose normal.  Mouth/Throat: Oropharynx is clear and moist.  Eyes: Conjunctivae and EOM are normal. Pupils are equal,  round, and reactive to light. Right eye exhibits no discharge. Left eye exhibits no discharge. No scleral icterus.  Neck: Normal range of motion. Neck supple. No tracheal deviation present. No thyromegaly present.  Cardiovascular: Normal rate, regular rhythm and normal heart sounds.  Exam reveals no gallop and no friction rub.   No murmur heard. Pulmonary/Chest: Effort normal and breath sounds normal. No accessory muscle usage. No tachypnea. No respiratory distress. He has no decreased breath sounds. He has no wheezes. He has no rhonchi. He has no rales. He exhibits no tenderness.  Musculoskeletal: Normal range of motion. He exhibits no edema.  Lymphadenopathy:    He has no cervical adenopathy.  Neurological: He is alert and oriented to person, place, and time. No cranial nerve deficit. Coordination normal.  Skin: Skin is warm and dry. No rash noted. He is not diaphoretic. No erythema. No pallor.  Psychiatric: He has a normal mood and affect. His behavior is normal. Judgment and thought content normal.          Assessment & Plan:   Problem List Items Addressed This Visit      Unprioritized   Essential hypertension - Primary    Evaluation to date including endocrine evaluation, ECHO and renal artery doppler all normal. BP persistently elevated. Will start Carvedilol 3.125mg  bid. Discussed pros and cons of this medication. He will email with BP readings. Follow up in 4 weeks or sooner as needed.    Relevant Medications      carvedilol (COREG) tablet       Return in about 4 weeks (around 02/28/2014) for Recheck.

## 2014-01-31 NOTE — Patient Instructions (Signed)
Start Carvedilol 3.125mg  twice daily.  Follow up in 4 weeks or sooner as needed.

## 2014-02-04 ENCOUNTER — Encounter: Payer: Self-pay | Admitting: Internal Medicine

## 2014-03-17 ENCOUNTER — Ambulatory Visit: Payer: BC Managed Care – PPO | Admitting: Internal Medicine

## 2014-04-07 ENCOUNTER — Ambulatory Visit: Payer: Self-pay | Admitting: Internal Medicine

## 2014-04-25 ENCOUNTER — Encounter: Payer: Self-pay | Admitting: Internal Medicine

## 2014-04-25 ENCOUNTER — Ambulatory Visit (INDEPENDENT_AMBULATORY_CARE_PROVIDER_SITE_OTHER): Payer: BLUE CROSS/BLUE SHIELD | Admitting: Internal Medicine

## 2014-04-25 VITALS — BP 142/92 | HR 65 | Resp 12 | Ht 69.0 in | Wt 194.4 lb

## 2014-04-25 DIAGNOSIS — M799 Soft tissue disorder, unspecified: Secondary | ICD-10-CM | POA: Insufficient documentation

## 2014-04-25 DIAGNOSIS — L989 Disorder of the skin and subcutaneous tissue, unspecified: Secondary | ICD-10-CM

## 2014-04-25 DIAGNOSIS — I10 Essential (primary) hypertension: Secondary | ICD-10-CM

## 2014-04-25 NOTE — Progress Notes (Signed)
Subjective:    Patient ID: STCLAIR SZYMBORSKI, male    DOB: [birth year], 24 y.o.   MRN: [id number]  HPI  24YO male presents for follow up.  HTN -  Has not been monitoring BP at home. No chest pain, HA.  Recently had follow up at [facility] and BP 139/86. Approved for full participation in physical activity. Back running and exercising at local gym. No pain noted in leg.  Concerned about spot on right upper back. Not tender to palpation. Not changing in size. No drainage from site. No fever, chills.  Past medical, surgical, family and social history per today's encounter.  Review of Systems  Constitutional: Negative for fever, chills, activity change, appetite change, fatigue and unexpected weight change.  Eyes: Negative for visual disturbance.  Respiratory: Negative for cough and shortness of breath.   Cardiovascular: Negative for chest pain, palpitations and leg swelling.  Gastrointestinal: Negative for abdominal pain and abdominal distention.  Genitourinary: Negative for dysuria, urgency and difficulty urinating.  Musculoskeletal: Negative for myalgias, arthralgias and gait problem.  Skin: Negative for color change and rash.  Hematological: Negative for adenopathy.  Psychiatric/Behavioral: Negative for sleep disturbance and dysphoric mood. The patient is not nervous/anxious.        Objective:    BP 142/92 mmHg  Pulse 65  Resp 12  Ht  (1.753 m)  Wt 194 lb 6.4 oz (88.179 kg)  BMI 28.69 kg/m2  SpO2 98% Physical Exam  Constitutional: He is oriented to person, place, and time. He appears well-developed and well-nourished. No distress.  HENT:  Head: Normocephalic and atraumatic.  Right Ear: External ear normal.  Left Ear: External ear normal.  Nose: Nose normal.  Mouth/Throat: Oropharynx is clear and moist. No oropharyngeal exudate.  Eyes: Conjunctivae and EOM are normal. Pupils are equal, round, and reactive to light. Right eye exhibits no discharge. Left eye exhibits no  discharge. No scleral icterus.  Neck: Normal range of motion. Neck supple. No tracheal deviation present. No thyromegaly present.  Cardiovascular: Normal rate, regular rhythm and normal heart sounds.  Exam reveals no gallop and no friction rub.   No murmur heard. Pulmonary/Chest: Effort normal and breath sounds normal. No accessory muscle usage. No tachypnea. No respiratory distress. He has no decreased breath sounds. He has no wheezes. He has no rhonchi. He has no rales. He exhibits no tenderness.  Musculoskeletal: Normal range of motion. He exhibits no edema.  Lymphadenopathy:    He has no cervical adenopathy.  Neurological: He is alert and oriented to person, place, and time. No cranial nerve deficit. Coordination normal.  Skin: Skin is warm and dry. No rash noted. He is not diaphoretic. No erythema. No pallor.     Psychiatric: He has a normal mood and affect. His behavior is normal. Judgment and thought content normal.          Assessment & Plan:   Problem List Items Addressed This Visit      Unprioritized   Essential hypertension - Primary    BP Readings from Last 3 Encounters:  04/25/14 142/92  01/31/14 150/88  12/24/13 130/98   BP continues to be mildly elevated, however better controlled at home. Will have him email with readings. Discussed potentially increasing Carvedilol to 6.25mg  daily. Follow up in 6 months and prn.      Skin lesion of back    Skin lesion most consistent with sebaceous cyst versus lipoma. Discussed referral to dermatology for excision. He declines for now, but will  call if he would like to set this up.          Return in about 6 months (around 10/26/2014) for Recheck.

## 2014-04-25 NOTE — Assessment & Plan Note (Signed)
BP Readings from Last 3 Encounters:  04/25/14 142/92  01/31/14 150/88  12/24/13 130/98   BP continues to be mildly elevated, however better controlled at home. Will have him email with readings. Discussed potentially increasing Carvedilol to 6.25mg  daily. Follow up in 6 months and prn.

## 2014-04-25 NOTE — Progress Notes (Signed)
Pre visit review using our clinic review tool, if applicable. No additional management support is needed unless otherwise documented below in the visit note. 

## 2014-04-25 NOTE — Assessment & Plan Note (Signed)
Skin lesion most consistent with sebaceous cyst versus lipoma. Discussed referral to dermatology for excision. He declines for now, but will call if he would like to set this up.

## 2014-06-30 ENCOUNTER — Other Ambulatory Visit: Payer: Self-pay | Admitting: Internal Medicine

## 2014-10-27 ENCOUNTER — Ambulatory Visit: Payer: BLUE CROSS/BLUE SHIELD | Admitting: Internal Medicine

## 2014-11-12 ENCOUNTER — Encounter: Payer: Self-pay | Admitting: Internal Medicine

## 2014-11-12 ENCOUNTER — Ambulatory Visit (INDEPENDENT_AMBULATORY_CARE_PROVIDER_SITE_OTHER): Payer: BLUE CROSS/BLUE SHIELD | Admitting: Internal Medicine

## 2014-11-12 VITALS — BP 126/84 | HR 61 | Temp 97.8°F | Wt 188.5 lb

## 2014-11-12 DIAGNOSIS — Z23 Encounter for immunization: Secondary | ICD-10-CM

## 2014-11-12 DIAGNOSIS — I10 Essential (primary) hypertension: Secondary | ICD-10-CM | POA: Diagnosis not present

## 2014-11-12 MED ORDER — CARVEDILOL 3.125 MG PO TABS: 3.1250 mg | ORAL_TABLET | Freq: Two times a day (BID) | ORAL | Status: DC

## 2014-11-12 NOTE — Patient Instructions (Signed)
Follow up in 6 months or as needed

## 2014-11-12 NOTE — Assessment & Plan Note (Signed)
BP Readings from Last 3 Encounters:  11/12/14 126/84  04/25/14 142/92  01/31/14 150/88   BP well controlled on Carvedilol. Will continue. Fasting labs next visit.

## 2014-11-12 NOTE — Progress Notes (Signed)
Subjective:    Patient ID: Daniel Sweeney, male    DOB: Apr 27, 1990, 24 y.o.   MRN: [id number]  HPI  24YO male presents for follow up.  HTN - Feeling well. Checked BP at home, close to 120/80s. No HA, chest pain.  No new concerns today. Staying active. Exercising at [employer].   Wt Readings from Last 3 Encounters:  11/12/14 188 lb 8 oz (85.503 kg)  04/25/14 194 lb 6.4 oz (88.179 kg)  01/31/14 196 lb 12 oz (89.245 kg)   BP Readings from Last 3 Encounters:  11/12/14 126/84  04/25/14 142/92  01/31/14 150/88    Past Medical History  Diagnosis Date  . Pulmonary embolism     in setting of knee surgery, s/p 6 months of coumadin  . Cellulitis of thigh   . Clotting disorder     Hx right lung  . Hypertension    Family History  Problem Relation Age of Onset  . Hyperlipidemia Mother   . Hypertension Mother    Past Surgical History  Procedure Laterality Date  . Patella fracture surgery  08/2009   Social History   Social History  . Marital Status: Single    Spouse Name: N/A  . Number of Children: N/A  . Years of Education: N/A   Social History Main Topics  . Smoking status: Never Smoker   . Smokeless tobacco: Never Used  . Alcohol Use: No  . Drug Use: No  . Sexual Activity: Not Asked   Other Topics Concern  . None   Social History Narrative   Regular Exercise -  Basketball, gym - 2 - 3 times a week   Daily Caffeine Use:  1 soda daily    Review of Systems  Constitutional: Negative for fever, chills, activity change, appetite change, fatigue and unexpected weight change.  Eyes: Negative for visual disturbance.  Respiratory: Negative for cough and shortness of breath.   Cardiovascular: Negative for chest pain, palpitations and leg swelling.  Gastrointestinal: Negative for abdominal pain and abdominal distention.  Genitourinary: Negative for dysuria, urgency and difficulty urinating.  Musculoskeletal: Negative for arthralgias and gait problem.  Skin:  Negative for color change and rash.  Hematological: Negative for adenopathy.  Psychiatric/Behavioral: Negative for sleep disturbance and dysphoric mood. The patient is not nervous/anxious.        Objective:    BP 126/84 mmHg  Pulse 61  Temp(Src) 97.8 F (36.6 C) (Oral)  Wt 188 lb 8 oz (85.503 kg)  SpO2 100% Physical Exam  Constitutional: He is oriented to person, place, and time. He appears well-developed and well-nourished. No distress.  HENT:  Head: Normocephalic and atraumatic.  Right Ear: External ear normal.  Left Ear: External ear normal.  Nose: Nose normal.  Mouth/Throat: Oropharynx is clear and moist. No oropharyngeal exudate.  Eyes: Conjunctivae and EOM are normal. Pupils are equal, round, and reactive to light. Right eye exhibits no discharge. Left eye exhibits no discharge. No scleral icterus.  Neck: Normal range of motion. Neck supple. No tracheal deviation present. No thyromegaly present.  Cardiovascular: Normal rate, regular rhythm and normal heart sounds.  Exam reveals no gallop and no friction rub.   No murmur heard. Pulmonary/Chest: Effort normal and breath sounds normal. No accessory muscle usage. No tachypnea. No respiratory distress. He has no decreased breath sounds. He has no wheezes. He has no rhonchi. He has no rales. He exhibits no tenderness.  Musculoskeletal: Normal range of motion. He exhibits no edema.  Lymphadenopathy:  He has no cervical adenopathy.  Neurological: He is alert and oriented to person, place, and time. No cranial nerve deficit. Coordination normal.  Skin: Skin is warm and dry. No rash noted. He is not diaphoretic. No erythema. No pallor.  Psychiatric: He has a normal mood and affect. His behavior is normal. Judgment and thought content normal.          Assessment & Plan:   Problem List Items Addressed This Visit      Unprioritized   Essential hypertension - Primary    BP Readings from Last 3 Encounters:  11/12/14 126/84    04/25/14 142/92  01/31/14 150/88   BP well controlled on Carvedilol. Will continue. Fasting labs next visit.       Relevant Medications   carvedilol (COREG) 3.125 MG tablet       Return in about 6 months (around 05/12/2015) for Recheck.

## 2014-11-12 NOTE — Progress Notes (Signed)
Pre visit review using our clinic review tool, if applicable. No additional management support is needed unless otherwise documented below in the visit note. 

## 2015-05-18 ENCOUNTER — Ambulatory Visit (INDEPENDENT_AMBULATORY_CARE_PROVIDER_SITE_OTHER): Payer: Managed Care, Other (non HMO) | Admitting: Internal Medicine

## 2015-05-18 ENCOUNTER — Encounter: Payer: Self-pay | Admitting: Internal Medicine

## 2015-05-18 VITALS — BP 118/81 | HR 58 | Temp 97.8°F | Ht 69.0 in | Wt 192.2 lb

## 2015-05-18 DIAGNOSIS — I10 Essential (primary) hypertension: Secondary | ICD-10-CM | POA: Diagnosis not present

## 2015-05-18 LAB — COMPREHENSIVE METABOLIC PANEL
ALBUMIN: 4.8 g/dL (ref 3.5–5.2)
ALT: 20 U/L (ref 0–53)
AST: 18 U/L (ref 0–37)
Alkaline Phosphatase: 56 U/L (ref 39–117)
BILIRUBIN TOTAL: 0.9 mg/dL (ref 0.2–1.2)
BUN: 20 mg/dL (ref 6–23)
CHLORIDE: 102 meq/L (ref 96–112)
CO2: 29 mEq/L (ref 19–32)
CREATININE: 1.22 mg/dL (ref 0.40–1.50)
Calcium: 10.1 mg/dL (ref 8.4–10.5)
GFR: 78.01 mL/min (ref >60.00)
Glucose, Bld: 78 mg/dL (ref 70–99)
Potassium: 5 mEq/L (ref 3.5–5.1)
Sodium: 137 mEq/L (ref 135–145)
TOTAL PROTEIN: 7.4 g/dL (ref 6.0–8.3)

## 2015-05-18 NOTE — Progress Notes (Signed)
Pre visit review using our clinic review tool, if applicable. No additional management support is needed unless otherwise documented below in the visit note. 

## 2015-05-18 NOTE — Patient Instructions (Signed)
Labs today.  Follow up in 6 months. 

## 2015-05-18 NOTE — Progress Notes (Signed)
Subjective:    Patient ID: Daniel Sweeney, male    DOB: May 14, 1990, 25 y.o.   MRN: [id number]  HPI  24YO male presents for follow up.  HTN - Compliant with medication. Does not check BP.   No new concerns today.    Wt Readings from Last 3 Encounters:  05/18/15 192 lb 4 oz (87.204 kg)  11/12/14 188 lb 8 oz (85.503 kg)  04/25/14 194 lb 6.4 oz (88.179 kg)   BP Readings from Last 3 Encounters:  05/18/15 118/81  11/12/14 126/84  04/25/14 142/92    Past Medical History  Diagnosis Date  . Pulmonary embolism (HCC)     in setting of knee surgery, s/p 6 months of coumadin  . Cellulitis of thigh   . Clotting disorder (HCC)     Hx right lung  . Hypertension    Family History  Problem Relation Age of Onset  . Hyperlipidemia Mother   . Hypertension Mother    Past Surgical History  Procedure Laterality Date  . Patella fracture surgery  08/2009   Social History   Social History  . Marital Status: Single    Spouse Name: N/A  . Number of Children: N/A  . Years of Education: N/A   Social History Main Topics  . Smoking status: Never Smoker   . Smokeless tobacco: Never Used  . Alcohol Use: No  . Drug Use: No  . Sexual Activity: Not Asked   Other Topics Concern  . None   Social History Narrative   Regular Exercise -  Basketball, gym - 2 - 3 times a week   Daily Caffeine Use:  1 soda daily    Review of Systems  Constitutional: Negative for fever, chills, activity change, appetite change, fatigue and unexpected weight change.  Eyes: Negative for visual disturbance.  Respiratory: Negative for cough and shortness of breath.   Cardiovascular: Negative for chest pain, palpitations and leg swelling.  Gastrointestinal: Negative for abdominal pain and abdominal distention.  Genitourinary: Negative for dysuria, urgency and difficulty urinating.  Musculoskeletal: Negative for arthralgias and gait problem.  Skin: Negative for color change and rash.  Hematological: Negative  for adenopathy.  Psychiatric/Behavioral: Negative for sleep disturbance and dysphoric mood. The patient is not nervous/anxious.        Objective:    BP 118/81 mmHg  Pulse 58  Temp(Src) 97.8 F (36.6 C) (Oral)  Ht  (1.753 m)  Wt 192 lb 4 oz (87.204 kg)  BMI 28.38 kg/m2  SpO2 100% Physical Exam  Constitutional: He is oriented to person, place, and time. He appears well-developed and well-nourished. No distress.  HENT:  Head: Normocephalic and atraumatic.  Right Ear: External ear normal.  Left Ear: External ear normal.  Nose: Nose normal.  Mouth/Throat: Oropharynx is clear and moist. No oropharyngeal exudate.  Eyes: Conjunctivae and EOM are normal. Pupils are equal, round, and reactive to light. Right eye exhibits no discharge. Left eye exhibits no discharge. No scleral icterus.  Neck: Normal range of motion. Neck supple. No tracheal deviation present. No thyromegaly present.  Cardiovascular: Normal rate, regular rhythm and normal heart sounds.  Exam reveals no gallop and no friction rub.   No murmur heard. Pulmonary/Chest: Effort normal and breath sounds normal. No accessory muscle usage. No tachypnea. No respiratory distress. He has no decreased breath sounds. He has no wheezes. He has no rhonchi. He has no rales. He exhibits no tenderness.  Musculoskeletal: Normal range of motion. He exhibits no edema.  Lymphadenopathy:    He has no cervical adenopathy.  Neurological: He is alert and oriented to person, place, and time. No cranial nerve deficit. Coordination normal.  Skin: Skin is warm and dry. No rash noted. He is not diaphoretic. No erythema. No pallor.  Psychiatric: He has a normal mood and affect. His behavior is normal. Judgment and thought content normal.          Assessment & Plan:   Problem List Items Addressed This Visit      Unprioritized   Essential hypertension - Primary    BP Readings from Last 3 Encounters:  05/18/15 118/81  11/12/14 126/84    04/25/14 142/92   BP well controlled. Renal function with labs. Continue Carvedilol.      Relevant Orders   Comprehensive metabolic panel       Return in about 6 months (around 11/17/2015) for Physical.  Ronna PolioJennifer Walker, MD [facility]

## 2015-05-18 NOTE — Assessment & Plan Note (Signed)
BP Readings from Last 3 Encounters:  05/18/15 118/81  11/12/14 126/84  04/25/14 142/92   BP well controlled. Renal function with labs. Continue Carvedilol.

## 2015-07-29 ENCOUNTER — Encounter: Payer: Self-pay | Admitting: Internal Medicine

## 2015-07-30 ENCOUNTER — Encounter: Payer: Self-pay | Admitting: Family Medicine

## 2015-07-30 ENCOUNTER — Ambulatory Visit (INDEPENDENT_AMBULATORY_CARE_PROVIDER_SITE_OTHER): Payer: Managed Care, Other (non HMO) | Admitting: Family Medicine

## 2015-07-30 VITALS — BP 124/82 | HR 62 | Temp 97.7°F | Ht 69.0 in | Wt 194.5 lb

## 2015-07-30 DIAGNOSIS — L237 Allergic contact dermatitis due to plants, except food: Secondary | ICD-10-CM | POA: Insufficient documentation

## 2015-07-30 MED ORDER — PREDNISONE 10 MG PO TABS: ORAL_TABLET | ORAL | Status: DC

## 2015-07-30 NOTE — Assessment & Plan Note (Signed)
New acute problem. Treating with prednisone taper. 

## 2015-07-30 NOTE — Patient Instructions (Signed)
Take the prednisone as prescribed.  Take care  Dr. Adriana Simasook   [facility] Poison ivy is a inflammation of the skin (contact dermatitis) caused by touching the allergens on the leaves of the ivy plant following previous exposure to the plant. The rash usually appears 48 hours after exposure. The rash is usually bumps (papules) or blisters (vesicles) in a linear pattern. Depending on your own sensitivity, the rash may simply cause redness and itching, or it may also progress to blisters which may break open. These must be well cared for to prevent secondary bacterial (germ) infection, followed by scarring. Keep any open areas dry, clean, dressed, and covered with an antibacterial ointment if needed. The eyes may also get puffy. The puffiness is worst in the morning and gets better as the day progresses. This dermatitis usually heals without scarring, within 2 to 3 weeks without treatment. HOME CARE INSTRUCTIONS  Thoroughly wash with soap and water as soon as you have been exposed to poison ivy. You have about one half hour to remove the plant resin before it will cause the rash. This washing will destroy the oil or antigen on the skin that is causing, or will cause, the rash. Be sure to wash under your fingernails as any plant resin there will continue to spread the rash. Do not rub skin vigorously when washing affected area. Poison ivy cannot spread if no oil from the plant remains on your body. A rash that has progressed to weeping sores will not spread the rash unless you have not washed thoroughly. It is also important to wash any clothes you have been wearing as these may carry active allergens. The rash will return if you wear the unwashed clothing, even several days later. Avoidance of the plant in the future is the best measure. Poison ivy plant can be recognized by the number of leaves. Generally, poison ivy has three leaves with flowering branches on a single stem. Diphenhydramine may be purchased over  the counter and used as needed for itching. Do not drive with this medication if it makes you drowsy.Ask your caregiver about medication for children. SEEK MEDICAL CARE IF:  Open sores develop.  Redness spreads beyond area of rash.  You notice purulent (pus-like) discharge.  You have increased pain.  Other signs of infection develop (such as fever).   This information is not intended to replace advice given to you by your health care provider. Make sure you discuss any questions you have with your health care provider.   Document Released: 01/29/2000 Document Revised: 04/25/2011 Document Reviewed: 07/09/2014 Elsevier Interactive Patient Education [employer]2016 Elsevier Inc.

## 2015-07-30 NOTE — Progress Notes (Signed)
   Subjective:  Patient ID: Tora KindredJoshua N Symonds, male    DOB: 12/05/1990  Age: 25 y.o. MRN: [id number]  CC: Poison Lajoyce Cornersvy  HPI:  25 year old male presents with the above complaint.  Patient states that approximately one week ago he noticed the beginnings of a classic poison ivy rash. Rashes located on the lower legs bilaterally. He states it is been slowly worsening. He reports associated itching. He's been using an over-the-counter spray for the itching with some improvement. He states that he has not had resolution of his rash. No other intervention stride. No known exacerbating or relieving factors. No other complaints at this time.  Social Hx   Social History   Social History  . Marital Status: Single    Spouse Name: N/A  . Number of Children: N/A  . Years of Education: N/A   Social History Main Topics  . Smoking status: Never Smoker   . Smokeless tobacco: Never Used  . Alcohol Use: No  . Drug Use: No  . Sexual Activity: Not Asked   Other Topics Concern  . None   Social History Narrative   Regular Exercise -  Basketball, gym - 2 - 3 times a week   Daily Caffeine Use:  1 soda daily    Review of Systems  Constitutional: Negative.   Skin: Positive for rash.   Objective:  BP 124/82 mmHg  Pulse 62  Temp(Src) 97.7 F (36.5 C) (Oral)  Ht 5\' 9"  (1.753 m)  Wt 194 lb 8 oz (88.225 kg)  BMI 28.71 kg/m2  SpO2 98%  BP/Weight 07/30/2015 05/18/2015 11/12/2014  Systolic BP 124 118 126  Diastolic BP 82 81 84  Wt. (Lbs) 194.5 192.25 188.5  BMI 28.71 28.38 27.82   Physical Exam  Constitutional: He is oriented to person, place, and time. He appears well-developed. No distress.  Pulmonary/Chest: Effort normal.  Neurological: He is alert and oriented to person, place, and time.  Skin:  Lower legs - multiple areas of vesicular rash. Some areas with serous drainage.  Psychiatric: He has a normal mood and affect.  Vitals reviewed.  Lab Results  Component Value Date   WBC 10.6*  12/03/2013   HGB 16.5 12/03/2013   HCT 49.6 12/03/2013   PLT 324.0 12/03/2013   GLUCOSE 78 05/18/2015   ALT 20 05/18/2015   AST 18 05/18/2015   NA 137 05/18/2015   K 5.0 05/18/2015   CL 102 05/18/2015   CREATININE 1.22 05/18/2015   BUN 20 05/18/2015   CO2 29 05/18/2015   TSH 1.53 12/03/2013    Assessment & Plan:   Problem List Items Addressed This Visit    Poison ivy dermatitis - Primary    New acute problem. Treating with prednisone taper.         Meds ordered this encounter  Medications  . predniSONE (DELTASONE) 10 MG tablet    Sig: 50 mg (5 tablets) daily x 3 days, then 40 mg (4 tablets) daily x 3 days, then 30 mg (3 tablets) daily x 3 days, then 20 mg (2 tablets) daily x 3 days, then 10 mg (1 tablet) daily x 3 days.    Dispense:  45 tablet    Refill:  0   Follow-up: PRN  Everlene OtherJayce Anniya Whiters DO [facility]

## 2015-09-06 ENCOUNTER — Encounter: Payer: Self-pay | Admitting: Internal Medicine

## 2015-11-18 ENCOUNTER — Encounter: Payer: Managed Care, Other (non HMO) | Admitting: Internal Medicine

## 2015-12-29 ENCOUNTER — Other Ambulatory Visit: Payer: Self-pay | Admitting: Family Medicine

## 2015-12-29 ENCOUNTER — Telehealth: Payer: Self-pay | Admitting: Internal Medicine

## 2015-12-29 NOTE — Telephone Encounter (Signed)
Pt called needing a refill on his carvedilol (COREG) 3.125 MG tablet. He is a formed Dr. Dan HumphreysWalker pt is est care with Dr. De NurseSonneberg on 03/07/16. Please advise, Thank you!  Pharmacy - [facility] [place] [id number]  Call pt @ [phone]

## 2015-12-30 MED ORDER — CARVEDILOL 3.125 MG PO TABS: 3.1250 mg | ORAL_TABLET | Freq: Two times a day (BID) | ORAL | 3 refills | Status: DC

## 2015-12-30 NOTE — Telephone Encounter (Signed)
Refill sent to pharmacy.   

## 2015-12-30 NOTE — Telephone Encounter (Signed)
Please advise on refill.

## 2016-03-07 ENCOUNTER — Encounter: Payer: Self-pay | Admitting: Family Medicine

## 2016-03-07 ENCOUNTER — Ambulatory Visit (INDEPENDENT_AMBULATORY_CARE_PROVIDER_SITE_OTHER): Payer: Managed Care, Other (non HMO) | Admitting: Family Medicine

## 2016-03-07 VITALS — BP 142/92 | HR 75 | Temp 97.8°F | Ht 70.0 in | Wt 209.4 lb

## 2016-03-07 DIAGNOSIS — I10 Essential (primary) hypertension: Secondary | ICD-10-CM | POA: Diagnosis not present

## 2016-03-07 LAB — COMPREHENSIVE METABOLIC PANEL
ALT: 21 U/L (ref 0–53)
AST: 19 U/L (ref 0–37)
Albumin: 4.5 g/dL (ref 3.5–5.2)
Alkaline Phosphatase: 50 U/L (ref 39–117)
BUN: 14 mg/dL (ref 6–23)
CALCIUM: 9.8 mg/dL (ref 8.4–10.5)
CHLORIDE: 104 meq/L (ref 96–112)
CO2: 28 meq/L (ref 19–32)
CREATININE: 1.17 mg/dL (ref 0.40–1.50)
GFR: 81.31 mL/min (ref >60.00)
Glucose, Bld: 95 mg/dL (ref 70–99)
POTASSIUM: 4 meq/L (ref 3.5–5.1)
Sodium: 139 mEq/L (ref 135–145)
Total Bilirubin: 0.6 mg/dL (ref 0.2–1.2)
Total Protein: 7.3 g/dL (ref 6.0–8.3)

## 2016-03-07 MED ORDER — CARVEDILOL 3.125 MG PO TABS: 3.1250 mg | ORAL_TABLET | Freq: Two times a day (BID) | ORAL | 3 refills | Status: DC

## 2016-03-07 NOTE — Progress Notes (Signed)
  Daniel Rumps, MD Phone: [phone]  Daniel Sweeney is a 26 y.o. male who presents today for f/u.  HYPERTENSION  Disease Monitoring  Home BP Monitoring not checking Chest pain- no    Dyspnea- no Medications  Compliance-  Taking coreg, typically remembers to take the first dose though sometimes forgets the second dose. Has not taken in the last 2 days.  Edema- no Patient denies headaches and vision changes.  ROS see history of present illness  Objective  Physical Exam Vitals:   03/07/16 1403 03/07/16 1413  BP: (!) 160/110 (!) 142/92  Pulse: 75   Temp: 97.8 F (36.6 C)     BP Readings from Last 3 Encounters:  03/07/16 (!) 142/92  07/30/15 124/82  05/18/15 118/81   Wt Readings from Last 3 Encounters:  03/07/16 209 lb 6.4 oz (95 kg)  07/30/15 194 lb 8 oz (88.2 kg)  05/18/15 192 lb 4 oz (87.2 kg)    Physical Exam  Constitutional: No distress.  Cardiovascular: Normal rate, regular rhythm and normal heart sounds.   Pulmonary/Chest: Effort normal and breath sounds normal.  Musculoskeletal: He exhibits no edema.  Neurological: He is alert. Gait normal.  Skin: Skin is warm and dry. He is not diaphoretic.     Assessment/Plan: Please see individual problem list.  Essential hypertension Improved on recheck. Still above goal. Has been well controlled previously in the office. Has not taken his carvedilol in about 2 days. Suspect elevation is related to that. Refill will be sent to pharmacy. We'll check a CMP. He'll return in 1 week for blood pressure check with nursing and at that time he will bring in blood pressure readings from home. Given blood pressure parameters to call with sooner. Given return precautions.   Orders Placed This Encounter  Procedures  . Comp Met (CMET)    Meds ordered this encounter  Medications  . carvedilol (COREG) 3.125 MG tablet    Sig: Take 1 tablet (3.125 mg total) by mouth 2 (two) times daily.    Dispense:  60 tablet    Refill:  [place], MD [place]

## 2016-03-07 NOTE — Assessment & Plan Note (Signed)
Improved on recheck. Still above goal. Has been well controlled previously in the office. Has not taken his carvedilol in about 2 days. Suspect elevation is related to that. Refill will be sent to pharmacy. We'll check a CMP. He'll return in 1 week for blood pressure check with nursing and at that time he will bring in blood pressure readings from home. Given blood pressure parameters to call with sooner. Given return precautions.

## 2016-03-07 NOTE — Progress Notes (Signed)
Pre visit review using our clinic review tool, if applicable. No additional management support is needed unless otherwise documented below in the visit note. 

## 2016-03-07 NOTE — Patient Instructions (Addendum)
Nice to meet you. Please check your blood pressure daily at home 89-year-old laxed after 10-15 minutes. Your goal is less than 140/90. If over the next several days you notice your blood pressure is consistently higher than this please contact us so we can start a medication. We'll have you return in 1 week for blood pressure check as well. If you develop chest pain, shortness of breath, swelling, headaches, vision changes, or any new or changing symptoms please seek medical attention immediately.

## 2016-03-16 ENCOUNTER — Ambulatory Visit (INDEPENDENT_AMBULATORY_CARE_PROVIDER_SITE_OTHER): Payer: Managed Care, Other (non HMO)

## 2016-03-16 VITALS — BP 130/86 | HR 72 | Resp 16

## 2016-03-16 DIAGNOSIS — I10 Essential (primary) hypertension: Secondary | ICD-10-CM

## 2016-03-16 DIAGNOSIS — Z23 Encounter for immunization: Secondary | ICD-10-CM

## 2016-03-16 NOTE — Progress Notes (Signed)
Patient comes in for 1 week blood pressure check.  He has been taking blood pressure medications.   Home readings as follows 143/90, 142/85, 138/70,145/87, 139/83 and 139/89.  Please advise.

## 2016-03-16 NOTE — Progress Notes (Addendum)
Well-controlled in the office though slightly above goal at home. We could add another medication or increase the dose of his current medication. He could also potentially continue to monitor at home and if it continues to be elevated at home over the next several weeks we could add medication.  Daniel AlarEric Leandro Berkowitz mD.

## 2016-03-17 NOTE — Progress Notes (Signed)
Patient advised of below.  He states he will continue to monitor blood pressure and will call back and let us know readings.   He doesn't believe home machine blood pressure readings are accurate.   He would like to hold off on adding medication for now.

## 2016-06-27 IMAGING — CR DG KNEE COMPLETE 4+V*L*
1 series · 4 of 4 positions shown · non-contrast
Comparison: None.

CLINICAL DATA: Left knee pain after injury while playing softball.
Popping sounds. Difficulty weight-bearing.

EXAM:
LEFT KNEE - COMPLETE 4+ VIEW

[Series 1: dxr knee lt comp with obliques · 0.14mm/px · 4 of 4 slices shown]
[im 1/4]
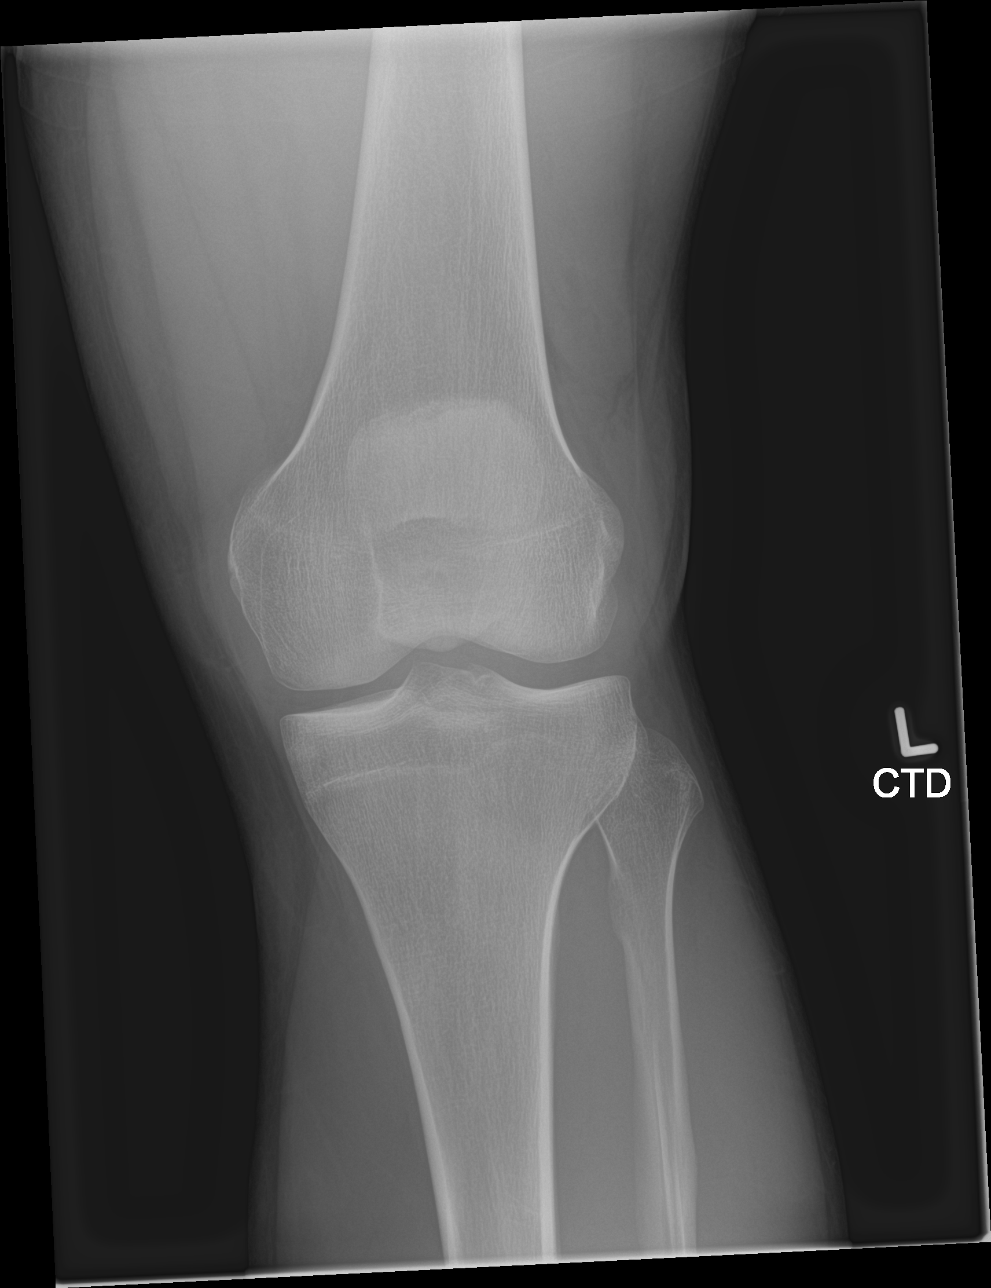
[im 2/4]
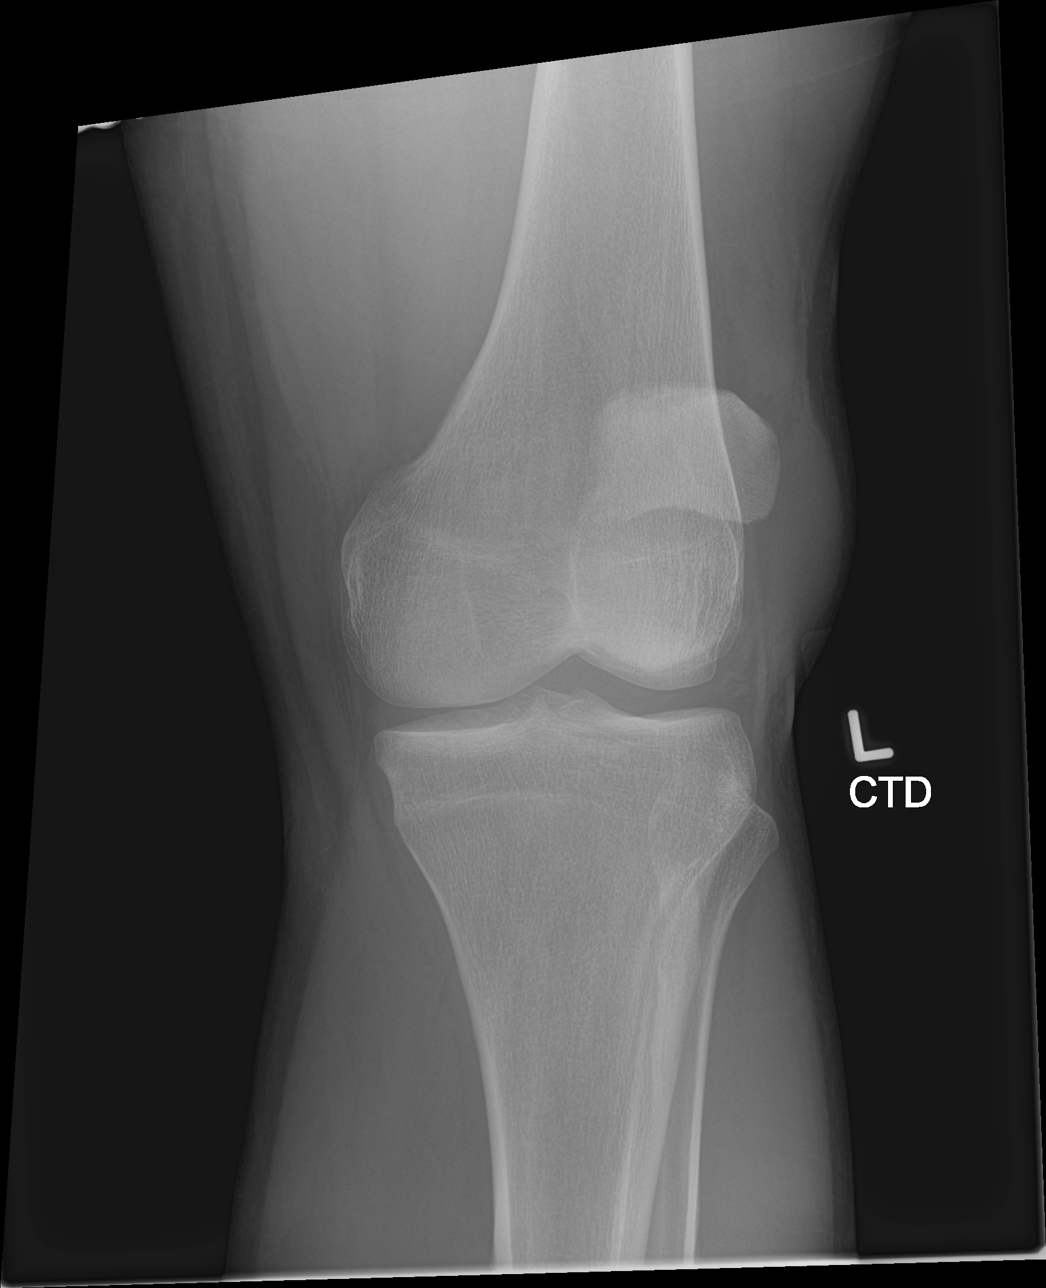
[im 3/4]
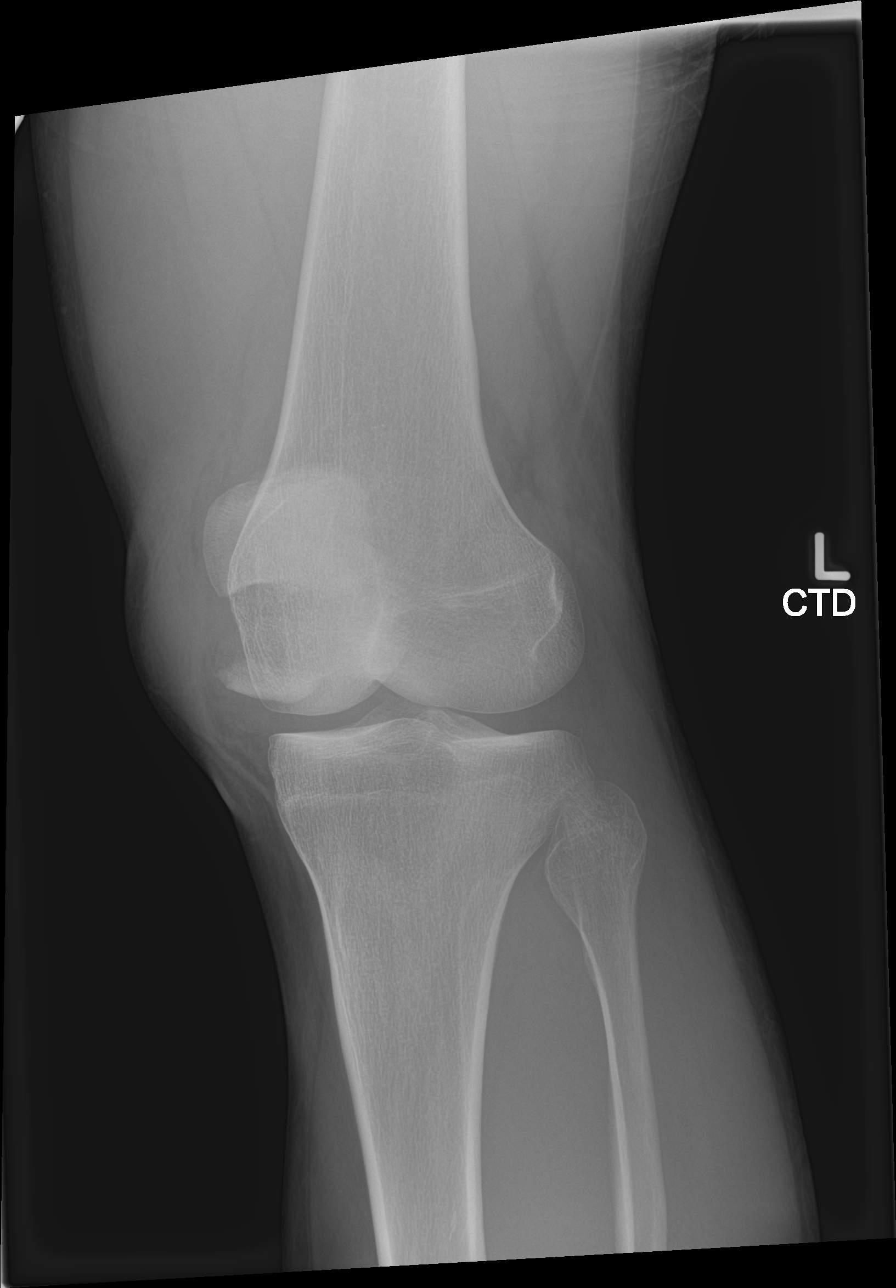
[im 4/4]
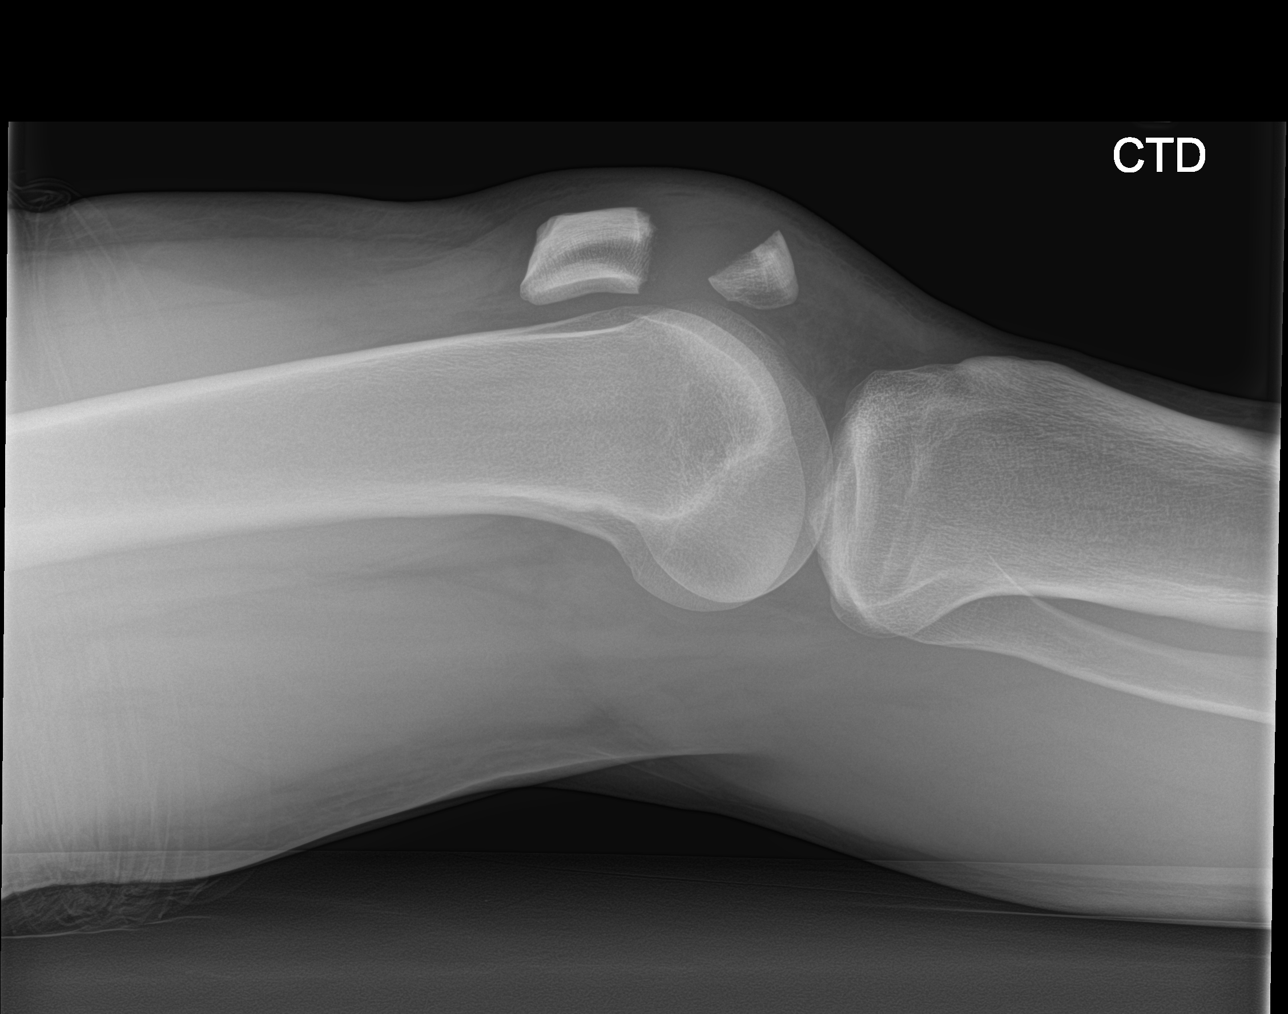

[4 of 4 positions shown; findings below may reference images not displayed]

FINDINGS: Transverse distracted fracture through the body of the patella.
Inferior displacement of the distal fracture fragment. Associated
effusion. Visualized femur and tibia appear intact.
IMPRESSION: Transverse fracture through the body of the patella with distraction
of fracture fragments.

## 2016-12-20 ENCOUNTER — Other Ambulatory Visit: Payer: Self-pay | Admitting: Family Medicine

## 2017-03-17 ENCOUNTER — Other Ambulatory Visit: Payer: Self-pay | Admitting: Family Medicine

## 2018-01-09 ENCOUNTER — Encounter: Payer: Self-pay | Admitting: Family Medicine

## 2018-01-09 ENCOUNTER — Ambulatory Visit (INDEPENDENT_AMBULATORY_CARE_PROVIDER_SITE_OTHER): Payer: 59 | Admitting: Family Medicine

## 2018-01-09 VITALS — BP 130/82 | HR 80 | Temp 98.8°F | Ht 70.0 in | Wt 191.4 lb

## 2018-01-09 DIAGNOSIS — M799 Soft tissue disorder, unspecified: Secondary | ICD-10-CM | POA: Diagnosis not present

## 2018-01-09 DIAGNOSIS — Z23 Encounter for immunization: Secondary | ICD-10-CM | POA: Diagnosis not present

## 2018-01-09 DIAGNOSIS — Z0001 Encounter for general adult medical examination with abnormal findings: Secondary | ICD-10-CM | POA: Diagnosis not present

## 2018-01-09 DIAGNOSIS — I10 Essential (primary) hypertension: Secondary | ICD-10-CM

## 2018-01-09 NOTE — Patient Instructions (Signed)
Nice to see you. We will get lab work today and contact you with results. Please try to exercise and decrease her sweet tea intake. Please monitor the lesion on her right upper back.  If it enlarges or changes in any manner please be evaluated.

## 2018-01-10 LAB — LIPID PANEL
CHOL/HDL RATIO: 5
Cholesterol: 195 mg/dL (ref 0–200)
HDL: 42.2 mg/dL (ref >39.00)
NONHDL: 153.26
Triglycerides: 278 mg/dL — ABNORMAL HIGH (ref 0.0–149.0)
VLDL: 55.6 mg/dL — ABNORMAL HIGH (ref 0.0–40.0)

## 2018-01-10 LAB — COMPREHENSIVE METABOLIC PANEL
ALT: 19 U/L (ref 0–53)
AST: 18 U/L (ref 0–37)
Albumin: 4.7 g/dL (ref 3.5–5.2)
Alkaline Phosphatase: 57 U/L (ref 39–117)
BUN: 17 mg/dL (ref 6–23)
CO2: 28 meq/L (ref 19–32)
CREATININE: 1.34 mg/dL (ref 0.40–1.50)
Calcium: 9.9 mg/dL (ref 8.4–10.5)
Chloride: 102 mEq/L (ref 96–112)
GFR: 68.49 mL/min (ref >60.00)
GLUCOSE: 84 mg/dL (ref 70–99)
Potassium: 3.9 mEq/L (ref 3.5–5.1)
SODIUM: 139 meq/L (ref 135–145)
Total Bilirubin: 0.6 mg/dL (ref 0.2–1.2)
Total Protein: 7.5 g/dL (ref 6.0–8.3)

## 2018-01-10 LAB — LDL CHOLESTEROL, DIRECT: Direct LDL: 122 mg/dL

## 2018-01-11 NOTE — Progress Notes (Signed)
Tommi Rumps, MD Phone: [phone]  Daniel Sweeney is a 27 y.o. male who presents today for cpe.  Exercise: He is not as consistent as he would like.  He will go to the gym for several weeks and then not go. Diet: Notes he does not eat great.  He is trying to eat less.  No soda.  He drinks 2 to 4 glasses of sweet tea daily. No family history of colon cancer.  He believes his paternal grandfather had prostate cancer in his 64s or 43s.  He will confirm this. He is due for tetanus vaccination and flu vaccination. No prior HIV screening.  He declines HIV screening. No tobacco use or illicit drug use.  Occasional alcohol use. He sees a Pharmacist, community twice a year.  Does not see an ophthalmologist. He has not been checking his blood pressure.  He has not been taking medication for his blood pressure. Patient notes he has a skin lesion in his mid left trapezius area.  He notes he had it checked out at least 7+ years ago and was advised it was nothing to worry about and to monitor.  Active Ambulatory Problems    Diagnosis Date Noted  . Essential hypertension 12/17/2013  . Soft tissue lesion 04/25/2014  . Poison ivy dermatitis 07/30/2015  . Encounter for general adult medical examination with abnormal findings 01/09/2018   Resolved Ambulatory Problems    Diagnosis Date Noted  . Elevated BP 12/03/2013  . Tachycardia 12/03/2013  . Patellar fracture 12/03/2013   Past Medical History:  Diagnosis Date  . Cellulitis of thigh   . Clotting disorder ([place])   . Hypertension   . Pulmonary embolism ([facility])     Family History  Problem Relation Age of Onset  . Hyperlipidemia Mother   . Hypertension Mother     Social History   Socioeconomic History  . Marital status: Single    Spouse name: Not on file  . Number of children: Not on file  . Years of education: Not on file  . Highest education level: Not on file  Occupational History  . Not on file  Social Needs  . Financial resource strain:  Not on file  . Food insecurity:    Worry: Not on file    Inability: Not on file  . Transportation needs:    Medical: Not on file    Non-medical: Not on file  Tobacco Use  . Smoking status: Never Smoker  . Smokeless tobacco: Never Used  Substance and Sexual Activity  . Alcohol use: No  . Drug use: No  . Sexual activity: Not on file  Lifestyle  . Physical activity:    Days per week: Not on file    Minutes per session: Not on file  . Stress: Not on file  Relationships  . Social connections:    Talks on phone: Not on file    Gets together: Not on file    Attends religious service: Not on file    Active member of club or organization: Not on file    Attends meetings of clubs or organizations: Not on file    Relationship status: Not on file  . Intimate partner violence:    Fear of current or ex partner: Not on file    Emotionally abused: Not on file    Physically abused: Not on file    Forced sexual activity: Not on file  Other Topics Concern  . Not on file  Social History Narrative  Regular Exercise -  Basketball, gym - 2 - 3 times a week   Daily Caffeine Use:  1 soda daily    ROS  General:  Negative for nexplained weight loss, fever Skin: Negative for new or changing mole, sore that won't heal HEENT: Negative for trouble hearing, trouble seeing, ringing in ears, mouth sores, hoarseness, change in voice, dysphagia. CV:  Negative for chest pain, dyspnea, edema, palpitations Resp: Negative for cough, dyspnea, hemoptysis GI: Negative for nausea, vomiting, diarrhea, constipation, abdominal pain, melena, hematochezia. GU: Negative for dysuria, incontinence, urinary hesitance, hematuria, vaginal or penile discharge, polyuria, sexual difficulty, lumps in testicle or breasts MSK: Negative for muscle cramps or aches, joint pain or swelling Neuro: Negative for headaches, weakness, numbness, dizziness, passing out/fainting Psych: Negative for depression, anxiety, memory  problems  Objective  Physical Exam Vitals:   01/09/18 1612 01/09/18 1657  BP: 140/90 130/82  Pulse: 80   Temp: 98.8 F (37.1 C)   SpO2: 98%     BP Readings from Last 3 Encounters:  01/09/18 130/82  03/16/16 130/86  03/07/16 (!) 142/92   Wt Readings from Last 3 Encounters:  01/09/18 191 lb 6.4 oz (86.8 kg)  03/07/16 209 lb 6.4 oz (95 kg)  07/30/15 194 lb 8 oz (88.2 kg)    Physical Exam  Constitutional: No distress.  HENT:  Head: Normocephalic and atraumatic.  Mouth/Throat: Oropharynx is clear and moist.  Eyes: Pupils are equal, round, and reactive to light. Conjunctivae are normal.  Cardiovascular: Normal rate, regular rhythm and normal heart sounds.  Pulmonary/Chest: Effort normal and breath sounds normal.  Abdominal: Soft. Bowel sounds are normal. He exhibits no distension. There is no tenderness.  Musculoskeletal: He exhibits no edema.  Neurological: He is alert.  Skin: Skin is warm and dry. He is not diaphoretic.     Psychiatric: He has a normal mood and affect.     Assessment/Plan:   Encounter for general adult medical examination with abnormal findings Dietary changes particularly taking and less sweet tea.  Encouraged exercise.  He will confirm his family history of prostate cancer.  Tetanus vaccination and flu vaccination given today.  He declines HIV screening.  Other lab work as outlined below.  Essential hypertension Blood pressure is adequately controlled for his age on no medications.  He will start checking at home.  We will recheck in 2 weeks with a nurse visit.  Soft tissue lesion Skin lesion is most consistent with a cyst or lipoma.  Discussed monitoring versus referral to dermatology versus ultrasound to evaluate.  He opted to monitor.  Given stability over many years I believe this is a reasonable option.   Orders Placed This Encounter  Procedures  . Tdap vaccine greater than or equal to 7yo IM  . Flu Vaccine QUAD 6+ mos PF IM (Fluarix Quad  PF)  . Lipid panel  . Comp Met (CMET)  . LDL cholesterol, direct    No orders of the defined types were placed in this encounter.    Tommi Rumps, MD [place]

## 2018-01-11 NOTE — Assessment & Plan Note (Signed)
Dietary changes particularly taking and less sweet tea.  Encouraged exercise.  He will confirm his family history of prostate cancer.  Tetanus vaccination and flu vaccination given today.  He declines HIV screening.  Other lab work as outlined below.

## 2018-01-11 NOTE — Assessment & Plan Note (Addendum)
Blood pressure is adequately controlled for his age on no medications.  He will start checking at home.  We will recheck in 2 weeks with a nurse visit.

## 2018-01-11 NOTE — Assessment & Plan Note (Signed)
Skin lesion is most consistent with a cyst or lipoma.  Discussed monitoring versus referral to dermatology versus ultrasound to evaluate.  He opted to monitor.  Given stability over many years I believe this is a reasonable option.

## 2018-01-25 ENCOUNTER — Ambulatory Visit: Payer: Self-pay

## 2018-01-25 DIAGNOSIS — Z0130 Encounter for examination of blood pressure without abnormal findings: Secondary | ICD-10-CM

## 2018-01-25 NOTE — Progress Notes (Addendum)
2nd BP readings have better SBP, but DBP remain elevated. Per CMA the patient denied any CP, SOB, Lightheadedness, headache, feeling faint/dizzy and felt OK to leave clinic. He was on coreg 3.125 BID in November 2018.  Will route to PCP for any medication changes.

## 2018-01-25 NOTE — Progress Notes (Signed)
Pt is here today for NV BP check.   Pt stated that he has been checking his BP at home and it has been around 130's over 90's.   Pt is not taking anything for high BP currently.   BP readings are the following:  RA: sitting normal cuff 150/90   O2: 98%, P 87  LA: sitting, normal cuff 150/100  O2: 98%, P:88   Will keep patient in room for about ten mins and will recheck pt did just come here from his job.   LA: sitting, large cuff 130/98   RA: sitting, large cuff 128/100   Routed to Leanora CoverLauren Guse since PCP is no here

## 2018-01-26 NOTE — Progress Notes (Signed)
Noted.  I would suggest that we resume medication.  He did well on carvedilol previously and we can send that in for him to resume once you speak with him.

## 2018-02-08 ENCOUNTER — Other Ambulatory Visit: Payer: Self-pay

## 2018-02-08 MED ORDER — CARVEDILOL 3.125 MG PO TABS: 3.1250 mg | ORAL_TABLET | Freq: Two times a day (BID) | ORAL | 3 refills | Status: DC

## 2018-02-08 NOTE — Progress Notes (Signed)
Pt called back. Spoke with patient. Pt advised and voiced understanding. Rx has been sent into pt's pharmacy.

## 2018-02-08 NOTE — Progress Notes (Signed)
Called patient and left a VM to call back. CRM created and sent to [facility] pool.  

## 2019-03-22 ENCOUNTER — Other Ambulatory Visit: Payer: Self-pay | Admitting: Family Medicine

## 2019-04-03 ENCOUNTER — Telehealth: Payer: Self-pay | Admitting: Family Medicine

## 2019-04-03 ENCOUNTER — Other Ambulatory Visit: Payer: Self-pay

## 2019-04-03 MED ORDER — CARVEDILOL 3.125 MG PO TABS: 3.1250 mg | ORAL_TABLET | Freq: Two times a day (BID) | ORAL | 0 refills | Status: DC

## 2019-04-03 NOTE — Progress Notes (Signed)
Patient called and requested carvedilol and he has not been seen in 1 year. He is scheduled for an appt in march and his medication was sent for a 30 day supply with no refills until his appt.  Perry Brucato,cma

## 2019-04-03 NOTE — Telephone Encounter (Signed)
Pt needs refill on carvedilol (COREG) 3.125 MG tablet. Made pt appt for next available which is on 05/06/2019. Pt is completely out

## 2019-04-03 NOTE — Telephone Encounter (Signed)
Medication was refilled for 30 days until the patients appt, I called and informed the patient and he understood.  Wen Munford,cma

## 2019-05-06 ENCOUNTER — Other Ambulatory Visit: Payer: Self-pay

## 2019-05-06 ENCOUNTER — Telehealth: Payer: PRIVATE HEALTH INSURANCE | Admitting: Family Medicine

## 2019-05-06 DIAGNOSIS — I10 Essential (primary) hypertension: Secondary | ICD-10-CM

## 2019-05-06 DIAGNOSIS — M799 Soft tissue disorder, unspecified: Secondary | ICD-10-CM | POA: Diagnosis not present

## 2019-05-06 MED ORDER — CARVEDILOL 6.25 MG PO TABS: 6.2500 mg | ORAL_TABLET | Freq: Two times a day (BID) | ORAL | 1 refills | Status: DC

## 2019-05-06 NOTE — Assessment & Plan Note (Signed)
Above goal. I will increase his coreg. F/u in one month.

## 2019-05-06 NOTE — Progress Notes (Signed)
  Marikay Alar, MD Phone: [phone]  Daniel Sweeney is a 29 y.o. male who presents today for f/u.  HYPERTENSION  Disease Monitoring  Home BP Monitoring 128/92 Chest pain- no    Dyspnea- no Medications  Compliance-  Taking coreg.  Edema- no  Soft tissue lesion: posterior base left neck over left trapezius. Present for years and relatively unchanged per patient. No pain.     Social History   Tobacco Use  Smoking Status Never Smoker  Smokeless Tobacco Never Used     ROS see history of present illness  Objective  Physical Exam Vitals:   05/06/19 1610  BP: 140/90  Pulse: 88  Temp: (!) 97 F (36.1 C)  SpO2: 97%    BP Readings from Last 3 Encounters:  05/06/19 140/90  01/09/18 130/82  03/16/16 130/86   Wt Readings from Last 3 Encounters:  05/06/19 190 lb 9.6 oz (86.5 kg)  01/09/18 191 lb 6.4 oz (86.8 kg)  03/07/16 209 lb 6.4 oz (95 kg)    Physical Exam Constitutional:      General: He is not in acute distress.    Appearance: He is not diaphoretic.  HENT:     Head:   Cardiovascular:     Rate and Rhythm: Normal rate and regular rhythm.     Heart sounds: Normal heart sounds.  Pulmonary:     Effort: Pulmonary effort is normal.     Breath sounds: Normal breath sounds.  Skin:    General: Skin is warm and dry.  Neurological:     Mental Status: He is alert.      Assessment/Plan: Please see individual problem list.  Essential hypertension Above goal. I will increase his coreg. F/u in one month.   Soft tissue lesion Suspect this is a lipoma given stability over a long period of time. Discussed referral to surgery for evaluation, completing an [place] or CT scan, or monitoring it. Patient wanted to monitor for now.   Patient will check with his insurance to determine if there is coverage for labs as outlined in the AVS.   No orders of the defined types were placed in this encounter.   Meds ordered this encounter  Medications  . carvedilol (COREG)  6.25 MG tablet    Sig: Take 1 tablet (6.25 mg total) by mouth 2 (two) times daily.    Dispense:  60 tablet    Refill:  1    This visit occurred during the SARS-CoV-2 public health emergency.  Safety protocols were in place, including screening questions prior to the visit, additional usage of staff PPE, and extensive cleaning of exam room while observing appropriate contact time as indicated for disinfecting solutions.    Marikay Alar, MD [facility]

## 2019-05-06 NOTE — Assessment & Plan Note (Signed)
Suspect this is a lipoma given stability over a long period of time. Discussed referral to surgery for evaluation, completing an [place] or CT scan, or monitoring it. Patient wanted to monitor for now.

## 2019-05-06 NOTE — Patient Instructions (Signed)
Nice to see you.  We will increase your coreg and see you back in one month.  Please see if your insurance covers labs. I am going to order a CMET and lipid panel for a diagnosis of hypertension.

## 2019-06-17 ENCOUNTER — Telehealth (INDEPENDENT_AMBULATORY_CARE_PROVIDER_SITE_OTHER): Payer: PRIVATE HEALTH INSURANCE | Admitting: Family Medicine

## 2019-06-17 ENCOUNTER — Encounter: Payer: Self-pay | Admitting: Family Medicine

## 2019-06-17 ENCOUNTER — Other Ambulatory Visit: Payer: Self-pay

## 2019-06-17 DIAGNOSIS — I10 Essential (primary) hypertension: Secondary | ICD-10-CM

## 2019-06-17 DIAGNOSIS — E663 Overweight: Secondary | ICD-10-CM | POA: Diagnosis not present

## 2019-06-17 NOTE — Progress Notes (Signed)
Virtual Visit via video Note  This visit type was conducted due to national recommendations for restrictions regarding the COVID-19 pandemic (e.g. social distancing).  This format is felt to be most appropriate for this patient at this time.  All issues noted in this document were discussed and addressed.  No physical exam was performed (except for noted visual exam findings with Video Visits).   I connected with Daniel Sweeney today at  4:00 PM EDT by a video enabled telemedicine application and verified that I am speaking with the correct person using two identifiers. Location patient: home Location provider: work Persons participating in the virtual visit: patient, provider  I discussed the limitations, risks, security and privacy concerns of performing an evaluation and management service by telephone and the availability of in person appointments. I also discussed with the patient that there may be a patient responsible charge related to this service. The patient expressed understanding and agreed to proceed.   Reason for visit: f/u.  HPI: HYPERTENSION  Disease Monitoring  Home BP Monitoring 130/low 90s Chest pain- no    Dyspnea- no Medications  Compliance-  Taking coreg 6.25 mg BID.  Edema- no  Overweight: Patient notes has not been exercising much recently as he got married and went on his honeymoon.  He tries to avoid fast food though does still eat there 2-3 times a week.  He eats red meat 1-2 times a week.  Not many vegetables the does like salads with tomatoes and cucumbers.  He does put quite a bit of [place] on his salads.     ROS: See pertinent positives and negatives per HPI.  Past Medical History:  Diagnosis Date  . Cellulitis of thigh   . Clotting disorder ([place])    Hx right lung  . Hypertension   . Pulmonary embolism ([place])    in setting of knee surgery, s/p 6 months of coumadin    Past Surgical History:  Procedure Laterality Date  . PATELLA FRACTURE SURGERY   08/2009    Family History  Problem Relation Age of Onset  . Hyperlipidemia Mother   . Hypertension Mother     SOCIAL HX: Non-smoker   Current Outpatient Medications:  .  carvedilol (COREG) 6.25 MG tablet, Take 1 tablet (6.25 mg total) by mouth 2 (two) times daily., Disp: 60 tablet, Rfl: 1  EXAM:  VITALS per patient if applicable:  GENERAL: alert, oriented, appears well and in no acute distress  HEENT: atraumatic, conjunttiva clear, no obvious abnormalities on inspection of external nose and ears  NECK: normal movements of the head and neck  LUNGS: on inspection no signs of respiratory distress, breathing rate appears normal, no obvious gross SOB, gasping or wheezing  CV: no obvious cyanosis  MS: moves all visible extremities without noticeable abnormality  PSYCH/NEURO: pleasant and cooperative, no obvious depression or anxiety, speech and thought processing grossly intact  ASSESSMENT AND PLAN:  Discussed the following assessment and plan:  Essential hypertension Still above goal at home.  He is going to take his blood pressure later today and include it and his pulse and a MyChart message.  Once I note his pulses I can determine if I can increase the carvedilol.  If I am not able to do that we will add amlodipine.  He will work on diet and exercise as well.  He will continue his current dose of carvedilol until I review his pulse and BP.  Overweight Chronic issue.  Encouraged dietary changes and exercise.  Orders Placed This Encounter  Procedures  . Comp Met (CMET)    Standing Status:   Future    Standing Expiration Date:   06/16/2020  . Lipid panel    Standing Status:   Future    Standing Expiration Date:   06/16/2020  . Hemoglobin A1c    Standing Status:   Future    Standing Expiration Date:   06/16/2020    No orders of the defined types were placed in this encounter.    I discussed the assessment and treatment plan with the patient. The patient was provided  an opportunity to ask questions and all were answered. The patient agreed with the plan and demonstrated an understanding of the instructions.   The patient was advised to call back or seek an in-person evaluation if the symptoms worsen or if the condition fails to improve as anticipated.    Tommi Rumps, MD

## 2019-06-17 NOTE — Assessment & Plan Note (Addendum)
Chronic issue.  Encouraged dietary changes and exercise.

## 2019-06-17 NOTE — Assessment & Plan Note (Addendum)
Still above goal at home.  He is going to take his blood pressure later today and include it and his pulse and a MyChart message.  Once I note his pulses I can determine if I can increase the carvedilol.  If I am not able to do that we will add amlodipine.  He will work on diet and exercise as well.  He will continue his current dose of carvedilol until I review his pulse and BP.

## 2019-06-18 ENCOUNTER — Encounter: Payer: Self-pay | Admitting: Family Medicine

## 2019-06-19 MED ORDER — CARVEDILOL 12.5 MG PO TABS: 12.5000 mg | ORAL_TABLET | Freq: Two times a day (BID) | ORAL | 1 refills | Status: DC

## 2019-06-20 ENCOUNTER — Telehealth: Payer: Self-pay

## 2019-07-17 NOTE — Telephone Encounter (Signed)
Patient is scheduled to see provider.  Jarquis Walker,cma  

## 2019-07-29 ENCOUNTER — Ambulatory Visit: Payer: 59 | Admitting: Family Medicine

## 2019-07-29 ENCOUNTER — Other Ambulatory Visit: Payer: Self-pay

## 2019-07-29 ENCOUNTER — Encounter: Payer: Self-pay | Admitting: Family Medicine

## 2019-07-29 DIAGNOSIS — M799 Soft tissue disorder, unspecified: Secondary | ICD-10-CM | POA: Diagnosis not present

## 2019-07-29 DIAGNOSIS — E663 Overweight: Secondary | ICD-10-CM

## 2019-07-29 DIAGNOSIS — I10 Essential (primary) hypertension: Secondary | ICD-10-CM | POA: Diagnosis not present

## 2019-07-29 NOTE — Progress Notes (Signed)
  Marikay Alar, MD Phone: [phone]  Daniel Sweeney is a 30 y.o. male who presents today for follow-up.  Hypertension: Generally similar to today.  Taking carvedilol.  Pulse has been around 70.  No chest pain, shortness of breath, or edema.  No history of hypertension in his family at a young age.  He did undergo evaluation with cardiology previously to rule out secondary causes.  Overweight: Patient has been going to the gym the last week or 2.  He is cut down on fast food and eats fairly healthy during the week.  On the weekends he eats more unhealthy though is trying to cut down on portion sizes.  Lipoma: This has been present for a few years.  It has not changed significantly.  No enlarging.  No pain.  Social History   Tobacco Use  Smoking Status Never Smoker  Smokeless Tobacco Never Used     ROS see history of present illness  Objective  Physical Exam Vitals:   07/29/19 1542  BP: 118/80  Pulse: 65  Temp: (!) 96.6 F (35.9 C)  SpO2: 99%    BP Readings from Last 3 Encounters:  07/29/19 118/80  05/06/19 140/90  01/09/18 130/82   Wt Readings from Last 3 Encounters:  07/29/19 198 lb 3.2 oz (89.9 kg)  06/17/19 195 lb (88.5 kg)  05/06/19 190 lb 9.6 oz (86.5 kg)    Physical Exam Constitutional:      General: He is not in acute distress.    Appearance: He is not diaphoretic.  Cardiovascular:     Rate and Rhythm: Normal rate and regular rhythm.     Heart sounds: Normal heart sounds.  Pulmonary:     Effort: Pulmonary effort is normal.     Breath sounds: Normal breath sounds.  Skin:    General: Skin is warm and dry.       Neurological:     Mental Status: He is alert.      Assessment/Plan: Please see individual problem list.  Essential hypertension At goal.  Continue carvedilol.  Follow-up in 4 months.  Encouraged diet and exercise.  Soft tissue lesion Suspect this is a lipoma given stability.  We discussed monitoring at this time.  If it changes  with any firmness, pain, or gets larger than he will let us know immediately.  Overweight Encouraged continued diet and exercise.    No orders of the defined types were placed in this encounter.   No orders of the defined types were placed in this encounter.   This visit occurred during the SARS-CoV-2 public health emergency.  Safety protocols were in place, including screening questions prior to the visit, additional usage of staff PPE, and extensive cleaning of exam room while observing appropriate contact time as indicated for disinfecting solutions.    Marikay Alar, MD [facility]

## 2019-07-29 NOTE — Assessment & Plan Note (Signed)
Encouraged continued diet and exercise. 

## 2019-07-29 NOTE — Assessment & Plan Note (Signed)
Suspect this is a lipoma given stability.  We discussed monitoring at this time.  If it changes with any firmness, pain, or gets larger than he will let us know immediately.

## 2019-07-29 NOTE — Patient Instructions (Signed)
Nice to see you. We will continue your carvedilol. Please continue diet and exercise.

## 2019-07-29 NOTE — Assessment & Plan Note (Signed)
At goal.  Continue carvedilol.  Follow-up in 4 months.  Encouraged diet and exercise.

## 2019-10-14 ENCOUNTER — Telehealth: Payer: Self-pay | Admitting: Family Medicine

## 2019-10-14 DIAGNOSIS — M799 Soft tissue disorder, unspecified: Secondary | ICD-10-CM

## 2019-10-14 NOTE — Telephone Encounter (Signed)
Patient has lump on his back for 10 years. When he spoke to Dr. Birdie Sons last, he was told to give Dr. Birdie Sons a call if it stated to bother him. Lump is painful and patient made an appointment with a surgeon today at 1:30 and needs a referral put in asap for that appointment. Location [facility], Dr. Maia Plan, general surgery.

## 2019-10-14 NOTE — Telephone Encounter (Signed)
Referral placed. Forwarding to [place] in case this needs to be sent over to [place].

## 2019-11-28 ENCOUNTER — Ambulatory Visit (INDEPENDENT_AMBULATORY_CARE_PROVIDER_SITE_OTHER): Payer: 59 | Admitting: Family Medicine

## 2019-11-28 ENCOUNTER — Encounter: Payer: Self-pay | Admitting: Family Medicine

## 2019-11-28 ENCOUNTER — Other Ambulatory Visit: Payer: Self-pay

## 2019-11-28 VITALS — BP 139/87 | HR 56 | Temp 98.1°F | Resp 17 | Ht 69.0 in | Wt 204.6 lb

## 2019-11-28 DIAGNOSIS — R635 Abnormal weight gain: Secondary | ICD-10-CM | POA: Diagnosis not present

## 2019-11-28 DIAGNOSIS — I10 Essential (primary) hypertension: Secondary | ICD-10-CM

## 2019-11-28 DIAGNOSIS — E782 Mixed hyperlipidemia: Secondary | ICD-10-CM | POA: Insufficient documentation

## 2019-11-28 DIAGNOSIS — Z7689 Persons encountering health services in other specified circumstances: Secondary | ICD-10-CM | POA: Insufficient documentation

## 2019-11-28 DIAGNOSIS — D689 Coagulation defect, unspecified: Secondary | ICD-10-CM | POA: Insufficient documentation

## 2019-11-28 MED ORDER — CARVEDILOL 12.5 MG PO TABS: 12.5000 mg | ORAL_TABLET | Freq: Two times a day (BID) | ORAL | 1 refills | Status: DC

## 2019-11-28 NOTE — Patient Instructions (Signed)
As we discussed, have your labs drawn in the next 1-2 weeks and we will contact you with the results.  Try to get exercise a minimum of 30 minutes per day at least 5 days per week as well as  adequate water intake all while measuring blood pressure a few times per week.  Keep a blood pressure log and bring back to clinic at your next visit.  If your readings are consistently over 130/80 to contact our office/send me a MyChart message and we will see you sooner.  Can try DASH and Mediterranean diet options, avoiding processed foods, lowering sodium intake, avoiding pork products, and eating a plant based diet for optimal health.  I have sent a refill on your prescription to your pharmacy.  Continue this as directed.  We will plan to see you back in 6 months for hypertension follow up visit and sooner if needed  You will receive a survey after today's visit either digitally by e-mail or paper by USPS mail. Your experiences and feedback matter to [place].  Please respond so we know how we are doing as we provide care for you.  Call us with any questions/concerns/needs.  It is my goal to be available to you for your health concerns.  Thanks for choosing me to be a partner in your healthcare needs!  Charlaine Dalton, FNP-C Family Nurse Practitioner [facility] Phone: [phone]

## 2019-11-28 NOTE — Assessment & Plan Note (Signed)
Status unknown.  Recheck labs.  Followup after labs.  

## 2019-11-28 NOTE — Assessment & Plan Note (Signed)
New patient establishment at [facility] for primary care services.  No acute concerns today.  Plan: 1. Labs to be drawn for baseline evaluation, last labs available in EMR from 2019. 2. RTC in 6 months for CPE and PRN

## 2019-11-28 NOTE — Assessment & Plan Note (Signed)
Controlled hypertension.  Pt is working on lifestyle modifications.  Taking medications tolerating well without side effects.  Complications:  Obesity, hx of clotting disorder  Plan: 1. Continue taking carvedilol 12.5mg  BID daily 2. Obtain labs today  3. Encouraged heart healthy diet and increasing exercise to 30 minutes most days of the week, going no more than 2 days in a row without exercise. 4. Check BP 1-2 x per week at home, keep log, and bring to clinic at next appointment. 5. Follow up 6 months.

## 2019-11-28 NOTE — Progress Notes (Signed)
Subjective:    Patient ID: Daniel Sweeney, male    DOB: 1990/06/09, 29 y.o.   MRN: [id number]  Daniel Sweeney is a 29 y.o. male presenting on 11/28/2019 for Establish Care (hypertension)   HPI  Daniel Sweeney presents to clinic as a new patient for establishment for primary care.  Previous PCP was at Dr. Caryl Bis with [facility] in [place].  Records will not be requested, are available through EMR.  Past medical, family, and surgical history reviewed w/ pt.  Daniel Sweeney has acute concerns for medication renewal for his carvedilol.    Depression screen [facility] 2/9 07/29/2019 05/06/2019 01/09/2018  Decreased Interest 0 0 0  Down, Depressed, Hopeless 0 0 0  PHQ - 2 Score 0 0 0    Social History   Tobacco Use  . Smoking status: Never Smoker  . Smokeless tobacco: Never Used  Substance Use Topics  . Alcohol use: Yes    Alcohol/week: 2.0 standard drinks    Types: 2 Standard drinks or equivalent per week  . Drug use: No    Review of Systems  Constitutional: Negative.   HENT: Negative.   Eyes: Negative.   Respiratory: Negative.   Cardiovascular: Negative.   Gastrointestinal: Negative.   Endocrine: Negative.   Genitourinary: Negative.   Musculoskeletal: Negative.   Skin: Negative.   Allergic/Immunologic: Negative.   Neurological: Negative.   Hematological: Negative.   Psychiatric/Behavioral: Negative.    Per HPI unless specifically indicated above     Objective:    BP 139/87 (BP Location: Left Arm, Patient Position: Sitting, Cuff Size: Normal)   Pulse (!) 56   Temp 98.1 F (36.7 C) (Oral)   Resp 17   Ht 5' 9"  (1.753 m)   Wt 204 lb 9.6 oz (92.8 kg)   SpO2 100%   BMI 30.21 kg/m   Wt Readings from Last 3 Encounters:  11/28/19 204 lb 9.6 oz (92.8 kg)  07/29/19 198 lb 3.2 oz (89.9 kg)  06/17/19 195 lb (88.5 kg)    Physical Exam Vitals and nursing note reviewed.  Constitutional:      General: He is not in acute distress.    Appearance: Normal  appearance. He is well-developed and well-groomed. He is obese. He is not ill-appearing or toxic-appearing.  HENT:     Head: Normocephalic and atraumatic.     Nose:     Comments: Lizbeth Bark is in place, covering mouth and nose. Eyes:     General:        Right eye: No discharge.        Left eye: No discharge.     Extraocular Movements: Extraocular movements intact.     Conjunctiva/sclera: Conjunctivae normal.     Pupils: Pupils are equal, round, and reactive to light.  Cardiovascular:     Rate and Rhythm: Normal rate and regular rhythm.     Pulses: Normal pulses.     Heart sounds: Normal heart sounds. No murmur heard.  No friction rub. No gallop.   Pulmonary:     Effort: Pulmonary effort is normal. No respiratory distress.     Breath sounds: Normal breath sounds.  Skin:    General: Skin is warm and dry.     Capillary Refill: Capillary refill takes less than 2 seconds.  Neurological:     General: No focal deficit present.     Mental Status: He is alert and oriented to person, place, and time.  Psychiatric:  Attention and Perception: Attention and perception normal.        Mood and Affect: Mood and affect normal.        Speech: Speech normal.        Behavior: Behavior normal. Behavior is cooperative.        Thought Content: Thought content normal.        Cognition and Memory: Cognition and memory normal.    Results for orders placed or performed in visit on 01/09/18  Lipid panel  Result Value Ref Range   Cholesterol 195 0 - 200 mg/dL   Triglycerides 278.0 (H) 0 - 149 mg/dL   HDL 42.20 >39.00 mg/dL   VLDL 55.6 (H) 0.0 - 40.0 mg/dL   Total CHOL/HDL Ratio 5    NonHDL 153.26   Comp Met (CMET)  Result Value Ref Range   Sodium 139 135 - 145 mEq/L   Potassium 3.9 3.5 - 5.1 mEq/L   Chloride 102 96 - 112 mEq/L   CO2 28 19 - 32 mEq/L   Glucose, Bld 84 70 - 99 mg/dL   BUN 17 6 - 23 mg/dL   Creatinine, Ser 1.34 0.40 - 1.50 mg/dL   Total Bilirubin 0.6 0.2 - 1.2 mg/dL    Alkaline Phosphatase 57 39 - 117 U/L   AST 18 0 - 37 U/L   ALT 19 0 - 53 U/L   Total Protein 7.5 6.0 - 8.3 g/dL   Albumin 4.7 3.5 - 5.2 g/dL   Calcium 9.9 8.4 - 10.5 mg/dL   GFR 68.49 >60.00 mL/min  LDL cholesterol, direct  Result Value Ref Range   Direct LDL 122.0 mg/dL      Assessment & Plan:   Problem List Items Addressed This Visit      Cardiovascular and Mediastinum   Essential hypertension - Primary    Controlled hypertension.  Pt is working on lifestyle modifications.  Taking medications tolerating well without side effects.  Complications:  Obesity, hx of clotting disorder  Plan: 1. Continue taking carvedilol 12.78m BID daily 2. Obtain labs today  3. Encouraged heart healthy diet and increasing exercise to 30 minutes most days of the week, going no more than 2 days in a row without exercise. 4. Check BP 1-2 x per week at home, keep log, and bring to clinic at next appointment. 5. Follow up 6 months.       Relevant Medications   carvedilol (COREG) 12.5 MG tablet   Other Relevant Orders   CBC with Differential   COMPLETE METABOLIC PANEL WITH GFR     Other   Elevated triglycerides with high cholesterol    Status unknown.  Recheck labs.  Followup after labs.       Relevant Medications   carvedilol (COREG) 12.5 MG tablet   Other Relevant Orders   Lipid Profile   Encounter to establish care with new doctor    [facility]  No acute concerns today.  Plan: 1. Labs to be drawn for baseline evaluation, last labs available in EMR from 2019. 2. RTC in 6 months for CPE and PRN       Other Visit Diagnoses    Weight gain       Relevant Orders   TSH + free T4      Meds ordered this encounter  Medications  . carvedilol (COREG) 12.5 MG tablet    Sig: Take 1 tablet (12.5 mg total) by mouth 2 (two) times daily.  Dispense:  180 tablet    Refill:  1    Follow up plan: Return in about 6 months (around 05/28/2020)  for HTN F/U Appt.   Harlin Rain, [place] [facility] Nurse Practitioner [facility] 11/28/2019, 12:14 PM

## 2019-12-02 ENCOUNTER — Ambulatory Visit: Payer: 59 | Admitting: Family Medicine

## 2020-05-28 ENCOUNTER — Ambulatory Visit: Payer: Self-pay | Admitting: Family Medicine

## 2020-06-11 ENCOUNTER — Other Ambulatory Visit: Payer: Self-pay

## 2020-06-11 ENCOUNTER — Encounter: Payer: Self-pay | Admitting: Internal Medicine

## 2020-06-11 ENCOUNTER — Ambulatory Visit: Payer: 59 | Admitting: Internal Medicine

## 2020-06-11 VITALS — BP 137/74 | HR 69 | Temp 97.7°F | Resp 17 | Ht 69.0 in | Wt 210.6 lb

## 2020-06-11 DIAGNOSIS — Z86711 Personal history of pulmonary embolism: Secondary | ICD-10-CM

## 2020-06-11 DIAGNOSIS — B079 Viral wart, unspecified: Secondary | ICD-10-CM

## 2020-06-11 DIAGNOSIS — E782 Mixed hyperlipidemia: Secondary | ICD-10-CM | POA: Diagnosis not present

## 2020-06-11 DIAGNOSIS — I10 Essential (primary) hypertension: Secondary | ICD-10-CM

## 2020-06-11 DIAGNOSIS — E6609 Other obesity due to excess calories: Secondary | ICD-10-CM

## 2020-06-11 DIAGNOSIS — Z6831 Body mass index (BMI) 31.0-31.9, adult: Secondary | ICD-10-CM

## 2020-06-11 MED ORDER — LISINOPRIL 10 MG PO TABS: 10.0000 mg | ORAL_TABLET | Freq: Every day | ORAL | 0 refills | Status: DC

## 2020-06-11 NOTE — Patient Instructions (Signed)

## 2020-06-11 NOTE — Assessment & Plan Note (Signed)
Uncontrolled on Carvedilol, will d/c RX for Lisinopril 10 mg daily, discussed risk of angioedema Reinforced DASH diet and exercise for weight loss  RTC in 2 week for nurse visit BP check

## 2020-06-11 NOTE — Progress Notes (Signed)
Subjective:    Patient ID: Daniel Sweeney, male    DOB: 09/02/1990, 30 y.o.   MRN: [id number]  HPI  Pt presents to the clinic today for follow up of chronic conditions. He is establish care with me today, transferring care from [facility], [facility].  HTN: His BP today is 137/74. He is taking Carvedilol as prescribed. ECG from 12/2013 reviewed.  HLD: His last LDL was 122, triglycerides 278, 12/2017. He is not taking any cholesterol lowering medication at this time. He tries to consume a low fat diet.  Hx of PE: In right lung, after patellar fracture repair in 2011. He was on anticoagulation for 6 months but not currently on any anticoagulation.  Review of Systems      Past Medical History:  Diagnosis Date  . Cellulitis of thigh   . Clotting disorder (HCC)    Hx right lung  . Hypertension   . Pulmonary embolism (HCC)    in setting of knee surgery, s/p 6 months of coumadin    Current Outpatient Medications  Medication Sig Dispense Refill  . carvedilol (COREG) 12.5 MG tablet Take 1 tablet (12.5 mg total) by mouth 2 (two) times daily. 180 tablet 1   No current facility-administered medications for this visit.    No Known Allergies  Family History  Problem Relation Age of Onset  . Hyperlipidemia Mother   . Hypertension Mother   . Heart attack Maternal Grandfather   . Alzheimer's disease Maternal Grandfather   . Brain cancer Paternal Grandfather     Social History   Socioeconomic History  . Marital status: Single    Spouse name: Not on file  . Number of children: Not on file  . Years of education: Not on file  . Highest education level: Not on file  Occupational History  . Not on file  Tobacco Use  . Smoking status: Never Smoker  . Smokeless tobacco: Never Used  Substance and Sexual Activity  . Alcohol use: Yes    Alcohol/week: 2.0 standard drinks    Types: 2 Standard drinks or equivalent per week  . Drug use: No  . Sexual activity: Not on file  Other Topics  Concern  . Not on file  Social History Narrative   Regular Exercise -  Basketball, gym - 2 - 3 times a week   Daily Caffeine Use:  1 soda daily   Social Determinants of Health   Financial Resource Strain: Not on file  Food Insecurity: Not on file  Transportation Needs: Not on file  Physical Activity: Not on file  Stress: Not on file  Social Connections: Not on file  Intimate Partner Violence: Not on file     Constitutional: Denies fever, malaise, fatigue, headache or abrupt weight changes.  Skin: Pt reports a wart of his right thumb. Respiratory: Denies difficulty breathing, shortness of breath, cough or sputum production.   Cardiovascular: Denies chest pain, chest tightness, palpitations or swelling in the hands or feet.  Neurological: Denies dizziness, difficulty with memory, difficulty with speech or problems with balance and coordination.  Psych: Denies anxiety, depression, SI/HI.  No other specific complaints in a complete review of systems (except as listed in HPI above).  Objective:   Physical Exam  BP 137/74 (BP Location: Left Arm, Patient Position: Sitting, Cuff Size: Large)   Pulse 69   Temp 97.7 F (36.5 C) (Temporal)   Resp 17   Ht 5\' 9"  (1.753 m)   Wt 210 lb 9.6 oz (  95.5 kg)   SpO2 100%   BMI 31.10 kg/m   Wt Readings from Last 3 Encounters:  11/28/19 204 lb 9.6 oz (92.8 kg)  07/29/19 198 lb 3.2 oz (89.9 kg)  06/17/19 195 lb (88.5 kg)    General: Appears his stated age, obese, in NAD. Skin: 63mm wart noted of medial right thumb. Cardiovascular: Normal rate and rhythm. S1,S2 noted.  No murmur, rubs or gallops noted.  Pulmonary/Chest: Normal effort and positive vesicular breath sounds. No respiratory distress. No wheezes, rales or ronchi noted.  Neurological: Alert and oriented.   BMET    Component Value Date/Time   NA 139 01/09/2018 1647   K 3.9 01/09/2018 1647   CL 102 01/09/2018 1647   CO2 28 01/09/2018 1647   GLUCOSE 84 01/09/2018 1647   BUN  17 01/09/2018 1647   CREATININE 1.34 01/09/2018 1647   CALCIUM 9.9 01/09/2018 1647    Lipid Panel     Component Value Date/Time   CHOL 195 01/09/2018 1647   TRIG 278.0 (H) 01/09/2018 1647   HDL 42.20 01/09/2018 1647   CHOLHDL 5 01/09/2018 1647   VLDL 55.6 (H) 01/09/2018 1647    CBC    Component Value Date/Time   WBC 10.6 (H) 12/03/2013 1337   RBC 5.65 12/03/2013 1337   HGB 16.5 12/03/2013 1337   HCT 49.6 12/03/2013 1337   PLT 324.0 12/03/2013 1337   MCV 87.8 12/03/2013 1337   MCHC 33.3 12/03/2013 1337   RDW 12.8 12/03/2013 1337   LYMPHSABS 2.9 12/03/2013 1337   MONOABS 0.6 12/03/2013 1337   EOSABS 0.3 12/03/2013 1337   BASOSABS 0.1 12/03/2013 1337    Hgb A1C No results found for: HGBA1C         Assessment & Plan:   Viral Wart of Thumb:  Advised him to try to Compound W freeze spray OTC If no improvement, will refer to dermatology for wart removal.  History of PE:  Provoked s/p surgery Will need anticoag with any future surgeries  Return precautions discussed Nicki Reaper, NP This visit occurred during the SARS-CoV-2 public health emergency.  Safety protocols were in place, including screening questions prior to the visit, additional usage of staff PPE, and extensive cleaning of exam room while observing appropriate contact time as indicated for disinfecting solutions.

## 2020-06-11 NOTE — Assessment & Plan Note (Signed)
Will check lipid profile with annual exam Encouraged him to consume a low fat diet

## 2020-06-25 ENCOUNTER — Other Ambulatory Visit: Payer: 59

## 2020-07-01 ENCOUNTER — Other Ambulatory Visit: Payer: Self-pay

## 2020-07-01 MED ORDER — LISINOPRIL 10 MG PO TABS: 10.0000 mg | ORAL_TABLET | Freq: Every day | ORAL | 2 refills | Status: DC

## 2020-07-20 ENCOUNTER — Encounter: Payer: Self-pay | Admitting: Internal Medicine

## 2020-07-21 MED ORDER — AMLODIPINE BESYLATE 5 MG PO TABS: 5.0000 mg | ORAL_TABLET | Freq: Every day | ORAL | 0 refills | Status: DC

## 2020-07-21 NOTE — Addendum Note (Signed)
Addended by: Lorre Munroe on: 07/21/2020 12:09 PM   Modules accepted: Orders

## 2020-08-06 ENCOUNTER — Encounter: Payer: Self-pay | Admitting: Internal Medicine

## 2020-08-06 ENCOUNTER — Other Ambulatory Visit: Payer: Self-pay

## 2020-08-06 ENCOUNTER — Ambulatory Visit: Payer: 59 | Admitting: Internal Medicine

## 2020-08-06 VITALS — BP 128/88 | HR 74 | Ht 70.0 in | Wt 209.6 lb

## 2020-08-06 DIAGNOSIS — E6609 Other obesity due to excess calories: Secondary | ICD-10-CM

## 2020-08-06 DIAGNOSIS — I10 Essential (primary) hypertension: Secondary | ICD-10-CM

## 2020-08-06 DIAGNOSIS — Z6831 Body mass index (BMI) 31.0-31.9, adult: Secondary | ICD-10-CM | POA: Diagnosis not present

## 2020-08-06 NOTE — Progress Notes (Signed)
Subjective:    Patient ID: Daniel Sweeney, male    DOB: 1990-06-28, 30 y.o.   MRN: [id number]  HPI  Patient presents the clinic today for follow-up of hypertension.  He was started on Lisinopril but developed a dry cough.  This was discontinued and he was started on Amlodipine 5 mg daily.  He has been taking the medication as prescribed.  His BP today is 144/90.  ECG from 12/2013 reviewed.  Review of Systems     Past Medical History:  Diagnosis Date   Cellulitis of thigh    Clotting disorder (HCC)    Hx right lung   Hypertension    Pulmonary embolism (HCC)    in setting of knee surgery, s/p 6 months of coumadin    Current Outpatient Medications  Medication Sig Dispense Refill   amLODipine (NORVASC) 5 MG tablet Take 1 tablet (5 mg total) by mouth daily. 30 tablet 0   Cetirizine HCl (ZYRTEC ALLERGY) 10 MG CAPS Take by mouth.     No current facility-administered medications for this visit.    No Known Allergies  Family History  Problem Relation Age of Onset   Hyperlipidemia Mother    Hypertension Mother    Heart attack Maternal Grandfather    Alzheimer's disease Maternal Grandfather    Brain cancer Paternal Grandfather     Social History   Socioeconomic History   Marital status: Married    Spouse name: Not on file   Number of children: Not on file   Years of education: Not on file   Highest education level: Not on file  Occupational History   Not on file  Tobacco Use   Smoking status: Never   Smokeless tobacco: Never  Vaping Use   Vaping Use: Never used  Substance and Sexual Activity   Alcohol use: Yes    Alcohol/week: 2.0 standard drinks    Types: 2 Standard drinks or equivalent per week   Drug use: No   Sexual activity: Not on file  Other Topics Concern   Not on file  Social History Narrative   Regular Exercise -  Basketball, gym - 2 - 3 times a week   Daily Caffeine Use:  1 soda daily   Social Determinants of Health   Financial Resource Strain:  Not on file  Food Insecurity: Not on file  Transportation Needs: Not on file  Physical Activity: Not on file  Stress: Not on file  Social Connections: Not on file  Intimate Partner Violence: Not on file     Constitutional: Denies fever, malaise, fatigue, headache or abrupt weight changes.  Respiratory: Denies difficulty breathing, shortness of breath, cough or sputum production.   Cardiovascular: Denies chest pain, chest tightness, palpitations or swelling in the hands or feet.  Neurological: Denies dizziness, difficulty with memory, difficulty with speech or problems with balance and coordination.    No other specific complaints in a complete review of systems (except as listed in HPI above).  Objective:   Physical Exam  BP 128/88   Pulse 74   Ht 5\' 10"  (1.778 m)   Wt 209 lb 9.6 oz (95.1 kg)   SpO2 99%   BMI 30.07 kg/m   Wt Readings from Last 3 Encounters:  06/11/20 210 lb 9.6 oz (95.5 kg)  11/28/19 204 lb 9.6 oz (92.8 kg)  07/29/19 198 lb 3.2 oz (89.9 kg)    General: Appears her stated age, obese, in NAD. Cardiovascular: Normal rate and rhythm. S1,S2 noted.  No murmur, rubs or gallops noted. No JVD or BLE edema.  Pulmonary/Chest: Normal effort and positive vesicular breath sounds. No respiratory distress. No wheezes, rales or ronchi noted.  Neurological: Alert and oriented.    BMET    Component Value Date/Time   NA 139 01/09/2018 1647   K 3.9 01/09/2018 1647   CL 102 01/09/2018 1647   CO2 28 01/09/2018 1647   GLUCOSE 84 01/09/2018 1647   BUN 17 01/09/2018 1647   CREATININE 1.34 01/09/2018 1647   CALCIUM 9.9 01/09/2018 1647    Lipid Panel     Component Value Date/Time   CHOL 195 01/09/2018 1647   TRIG 278.0 (H) 01/09/2018 1647   HDL 42.20 01/09/2018 1647   CHOLHDL 5 01/09/2018 1647   VLDL 55.6 (H) 01/09/2018 1647    CBC    Component Value Date/Time   WBC 10.6 (H) 12/03/2013 1337   RBC 5.65 12/03/2013 1337   HGB 16.5 12/03/2013 1337   HCT 49.6  12/03/2013 1337   PLT 324.0 12/03/2013 1337   MCV 87.8 12/03/2013 1337   MCHC 33.3 12/03/2013 1337   RDW 12.8 12/03/2013 1337   LYMPHSABS 2.9 12/03/2013 1337   MONOABS 0.6 12/03/2013 1337   EOSABS 0.3 12/03/2013 1337   BASOSABS 0.1 12/03/2013 1337    Hgb A1C No results found for: HGBA1C          Assessment & Plan:   Nicki Reaper, NP This visit occurred during the SARS-CoV-2 public health emergency.  Safety protocols were in place, including screening questions prior to the visit, additional usage of staff PPE, and extensive cleaning of exam room while observing appropriate contact time as indicated for disinfecting solutions.

## 2020-08-08 DIAGNOSIS — E6609 Other obesity due to excess calories: Secondary | ICD-10-CM | POA: Insufficient documentation

## 2020-08-08 NOTE — Addendum Note (Signed)
Addended by: Lorre Munroe on: 08/08/2020 09:06 PM   Modules accepted: Orders

## 2020-08-08 NOTE — Assessment & Plan Note (Signed)
Recheck 128/88 Increase Amlodipine to 10 mg daily Reinforced DASH diet and exercise for weight loss Monitor BP at home and update me in 1 week.

## 2020-08-08 NOTE — Patient Instructions (Signed)

## 2020-08-08 NOTE — Assessment & Plan Note (Signed)
Encouraged diet and exercise for weight loss ?

## 2020-08-24 ENCOUNTER — Encounter: Payer: Self-pay | Admitting: Internal Medicine

## 2020-08-24 MED ORDER — AMLODIPINE BESYLATE 10 MG PO TABS: 10.0000 mg | ORAL_TABLET | Freq: Every day | ORAL | 1 refills | Status: DC

## 2021-06-07 ENCOUNTER — Other Ambulatory Visit: Payer: Self-pay | Admitting: Internal Medicine

## 2021-06-09 NOTE — Telephone Encounter (Signed)
Requested Prescriptions  ?Pending Prescriptions Disp Refills  ?? amLODipine (NORVASC) 10 MG tablet [Pharmacy Med Name: AMLODIPINE BESYLATE 10 MG TAB] 90 tablet 0  ?  Sig: TAKE 1 TABLET BY MOUTH DAILY  ?  ? Cardiovascular: Calcium Channel Blockers 2 Failed - 06/07/2021  5:24 PM  ?  ?  Failed - Valid encounter within last 6 months  ?  Recent Outpatient Visits   ?      ? 10 months ago Essential hypertension  ? [facility], [place] W, NP  ? 12 months ago History of pulmonary embolus (PE)  ? [facility], Salvadore Oxford, NP  ? 1 year ago Encounter to establish care with new doctor  ? [facility] [place], Jodelle Gross, FNP  ?  ?  ? ?  ?  ?  Passed - Last BP in normal range  ?  BP Readings from Last 1 Encounters:  ?08/08/20 128/88  ?   ?  ?  Passed - Last Heart Rate in normal range  ?  Pulse Readings from Last 1 Encounters:  ?08/06/20 74  ?   ?  ?  ? ? ?

## 2021-10-28 ENCOUNTER — Other Ambulatory Visit: Payer: Self-pay | Admitting: Internal Medicine

## 2021-10-29 NOTE — Telephone Encounter (Signed)
Called, left vm to call back to schedule CPE. Will refill medication for 30 days until OV is made.  Requested Prescriptions  Pending Prescriptions Disp Refills  . amLODipine (NORVASC) 10 MG tablet [Pharmacy Med Name: AMLODIPINE BESYLATE 10 MG TAB] 30 tablet 0    Sig: TAKE 1 TABLET BY MOUTH DAILY     Cardiovascular: Calcium Channel Blockers 2 Failed - 10/28/2021  5:08 PM      Failed - Valid encounter within last 6 months    Recent Outpatient Visits          1 year ago Essential hypertension   [facility], Salvadore Oxford, NP   1 year ago History of pulmonary embolus (PE)   [facility], Salvadore Oxford, NP   1 year ago Encounter to establish care with new doctor   [facility], Jodelle Gross, FNP             Passed - Last BP in normal range    BP Readings from Last 1 Encounters:  08/08/20 128/88         Passed - Last Heart Rate in normal range    Pulse Readings from Last 1 Encounters:  08/06/20 74

## 2021-12-13 ENCOUNTER — Ambulatory Visit
Admission: RE | Admit: 2021-12-13 | Discharge: 2021-12-13 | Disposition: A | Discharge status: Home / Self Care | Payer: 59 | Source: Ambulatory Visit | Attending: Emergency Medicine | Admitting: Emergency Medicine

## 2021-12-13 VITALS — BP 143/103 | HR 84 | Temp 98.1°F | Resp 18 | Ht 70.0 in | Wt 210.0 lb

## 2021-12-13 DIAGNOSIS — L03032 Cellulitis of left toe: Secondary | ICD-10-CM

## 2021-12-13 DIAGNOSIS — I10 Essential (primary) hypertension: Secondary | ICD-10-CM

## 2021-12-13 MED ORDER — DOXYCYCLINE HYCLATE 100 MG PO CAPS: 100.0000 mg | ORAL_CAPSULE | Freq: Two times a day (BID) | ORAL | 0 refills | Status: AC

## 2021-12-13 NOTE — ED Triage Notes (Signed)
Patient to Urgent Care with complaints of an infected ingrown toenail on his left big toe. Reports pain started on Friday and worsened over the weekend.   Has been applying a lose band-aid, has noticed some drainage.

## 2021-12-13 NOTE — ED Provider Notes (Signed)
Daniel Sweeney    CSN: [id number] Arrival date & time: 12/13/21  1848      History   Chief Complaint Chief Complaint  Patient presents with   Nail Problem    Ingrown toenail on my left big toe. Been bothering me for a few days. - Entered by patient    HPI Daniel Sweeney is a 31 y.o. male.  Patient presents with pain, swelling, redness, of his left great toe x3 days.  The area has been draining pus.  Treatment at home with Epsom salt soaks.  No fever, chills, numbness, weakness, paresthesias, or other symptoms.  No injury.  His medical history includes hypertension and pulmonary embolism.  Patient states he has not taken his blood pressure medication in several days.  The history is provided by the patient and medical records.    Past Medical History:  Diagnosis Date   Cellulitis of thigh    Clotting disorder (HCC)    Hx right lung   Hypertension    Pulmonary embolism (HCC)    in setting of knee surgery, s/p 6 months of coumadin    Patient Active Problem List   Diagnosis Date Noted   Class 1 obesity due to excess calories with serious comorbidity and body mass index (BMI) of 31.0 to 31.9 in adult 08/08/2020   Elevated triglycerides with high cholesterol 11/28/2019   Essential hypertension 12/17/2013    Past Surgical History:  Procedure Laterality Date   MOUTH SURGERY     PATELLA FRACTURE SURGERY  08/2009       Home Medications    Prior to Admission medications   Medication Sig Start Date End Date Taking? Authorizing Provider  doxycycline (VIBRAMYCIN) 100 MG capsule Take 1 capsule (100 mg total) by mouth 2 (two) times daily for 7 days. 12/13/21 12/20/21 Yes Mickie Bail, NP  amLODipine (NORVASC) 10 MG tablet TAKE 1 TABLET BY MOUTH DAILY 10/29/21   Lorre Munroe, NP  amLODipine (NORVASC) 5 MG tablet Take 2 tablets (10 mg total) by mouth daily. 08/08/20   Lorre Munroe, NP  Cetirizine HCl (ZYRTEC ALLERGY) 10 MG CAPS Take by mouth.    [provider]    Family History Family History  Problem Relation Age of Onset   Hyperlipidemia Mother    Hypertension Mother    Heart attack Maternal Grandfather    Alzheimer's disease Maternal Grandfather    Brain cancer Paternal Grandfather     Social History Social History   Tobacco Use   Smoking status: Never   Smokeless tobacco: Never  Vaping Use   Vaping Use: Never used  Substance Use Topics   Alcohol use: Yes    Alcohol/week: 2.0 standard drinks of alcohol    Types: 2 Standard drinks or equivalent per week   Drug use: No     Allergies   Lisinopril   Review of Systems Review of Systems  Constitutional:  Negative for chills and fever.  Skin:  Positive for color change and wound.  Neurological:  Negative for weakness and numbness.  All other systems reviewed and are negative.    Physical Exam Triage Vital Signs ED Triage Vitals  Enc Vitals Group     BP 12/13/21 1909 (!) 158/105     Pulse Rate 12/13/21 1905 84     Resp 12/13/21 1905 18     Temp 12/13/21 1905 98.1 F (36.7 C)     Temp src --      SpO2 12/13/21  1905 99 %     Weight 12/13/21 1907 210 lb (95.3 kg)     Height 12/13/21 1907 5\' 10"  (1.778 m)     Head Circumference --      Peak Flow --      Pain Score 12/13/21 1907 2     Pain Loc --      Pain Edu? --      Excl. in [place]? --    No data found.  Updated Vital Signs BP (!) 143/103   Pulse 84   Temp 98.1 F (36.7 C)   Resp 18   Ht 5\' 10"  (1.778 m)   Wt 210 lb (95.3 kg)   SpO2 99%   BMI 30.13 kg/m   Visual Acuity Right Eye Distance:   Left Eye Distance:   Bilateral Distance:    Right Eye Near:   Left Eye Near:    Bilateral Near:     Physical Exam Vitals and nursing note reviewed.  Constitutional:      General: He is not in acute distress.    Appearance: Normal appearance. He is well-developed. He is not ill-appearing.  HENT:     Mouth/Throat:     Mouth: Mucous membranes are moist.  Cardiovascular:     Rate and Rhythm:  Normal rate and regular rhythm.  Pulmonary:     Effort: Pulmonary effort is normal. No respiratory distress.  Musculoskeletal:        General: Swelling and tenderness present. No deformity. Normal range of motion.     Cervical back: Neck supple.  Skin:    General: Skin is warm and dry.     Capillary Refill: Capillary refill takes less than 2 seconds.     Findings: Erythema present.  Neurological:     General: No focal deficit present.     Mental Status: He is alert and oriented to person, place, and time.     Sensory: No sensory deficit.     Motor: No weakness.     Gait: Gait normal.  Psychiatric:        Mood and Affect: Mood normal.        Behavior: Behavior normal.       UC Treatments / Results  Labs (all labs ordered are listed, but only abnormal results are displayed) Labs Reviewed - No data to display  EKG   Radiology No results found.  Procedures Procedures (including critical care time)  Medications Ordered in UC Medications - No data to display  Initial Impression / Assessment and Plan / UC Course  I have reviewed the triage vital signs and the nursing notes.  Pertinent labs & imaging results that were available during my care of the patient were reviewed by me and considered in my medical decision making (see chart for details).    Paronychia and cellulitis of left great toe.  No I&D indicated as the area is already draining.  Treating with doxycycline.  Wound care instructions discussed.  Instructed patient to follow-up with a podiatrist.   Elevated blood pressure reading with hypertension.  Patient has not taken his blood pressure medication in several days.  He states he has a refill at the pharmacy but needs to go pick it up.  Education provided on managing hypertension.  Instructed patient to have his blood pressure rechecked by his PCP in 1 to 2 weeks.  He agrees to plan of care.  Final Clinical Impressions(s) / UC Diagnoses   Final diagnoses:   Cellulitis of great  toe of left foot  Paronychia of great toe of left foot  Elevated blood pressure reading in office with diagnosis of hypertension     Discharge Instructions      Take the antibiotic as directed.    Keep your wound clean and dry.  Wash it gently twice a day with soap and water.  Apply an antibiotic cream twice a day.    Follow up with a podiatrist tomorrow.    [facility] [address] [place], [place] [address] Phone: [phone]   Your blood pressure is elevated today at 158/105; repeat 143/103.  Please have this rechecked by your primary care provider in 1-2 weeks.          ED Prescriptions     Medication Sig Dispense Auth. Provider   doxycycline (VIBRAMYCIN) 100 MG capsule Take 1 capsule (100 mg total) by mouth 2 (two) times daily for 7 days. 14 capsule Mickie Bail, NP      PDMP not reviewed this encounter.   Mickie Bail, NP 12/13/21 1949

## 2021-12-13 NOTE — Discharge Instructions (Addendum)
Take the antibiotic as directed.    Keep your wound clean and dry.  Wash it gently twice a day with soap and water.  Apply an antibiotic cream twice a day.    Follow up with a podiatrist tomorrow.    [facility] [place], [place], [place] [address] Phone: [phone]   Your blood pressure is elevated today at 158/105; repeat 143/103.  Please have this rechecked by your primary care provider in 1-2 weeks.

## 2021-12-14 ENCOUNTER — Other Ambulatory Visit: Payer: Self-pay | Admitting: Internal Medicine

## 2021-12-15 NOTE — Telephone Encounter (Signed)
Requested medication (s) are due for refill today:yes  Requested medication (s) are on the active medication list: yes  Last refill:  10/29/21  Future visit scheduled: no  Notes to clinic:  Unable to refill per protocol, courtesy refill already given, routing for provider approval. Needs OV     Requested Prescriptions  Pending Prescriptions Disp Refills   amLODipine (NORVASC) 10 MG tablet [Pharmacy Med Name: AMLODIPINE BESYLATE 10 MG TAB] 30 tablet 0    Sig: TAKE 1 TABLET BY MOUTH DAILY     Cardiovascular: Calcium Channel Blockers 2 Failed - 12/14/2021  5:22 PM      Failed - Last BP in normal range    BP Readings from Last 1 Encounters:  12/13/21 (!) 143/103         Failed - Valid encounter within last 6 months    Recent Outpatient Visits           1 year ago Essential hypertension   [place], Coralie Keens, NP   1 year ago History of pulmonary embolus (PE)   [facility], Coralie Keens, NP   2 years ago Encounter to establish care with new doctor   [facility], Lupita Raider, [place]              Passed - Last Heart Rate in normal range    Pulse Readings from Last 1 Encounters:  12/13/21 84

## 2021-12-28 ENCOUNTER — Ambulatory Visit (INDEPENDENT_AMBULATORY_CARE_PROVIDER_SITE_OTHER): Payer: BC Managed Care – PPO | Admitting: Podiatry

## 2021-12-28 DIAGNOSIS — L600 Ingrowing nail: Secondary | ICD-10-CM | POA: Diagnosis not present

## 2021-12-28 MED ORDER — MUPIROCIN 2 % EX OINT: 1.0000 | TOPICAL_OINTMENT | Freq: Two times a day (BID) | CUTANEOUS | 1 refills | Status: AC

## 2021-12-28 NOTE — Progress Notes (Signed)
   Chief Complaint  Patient presents with   Ingrown Toenail    Patient is here for left foot great toe ingrown that he has had for 1 week, he was seen at urgent care and just finished abx.    Subjective: Patient presents today for evaluation of pain to the left great toe. Patient is concerned for possible ingrown nail.  It is very sensitive to touch.  Patient presents today for further treatment and evaluation.  Past Medical History:  Diagnosis Date   Cellulitis of thigh    Clotting disorder (HCC)    Hx right lung   Hypertension    Pulmonary embolism (HCC)    in setting of knee surgery, s/p 6 months of coumadin    Objective:  General: Well developed, nourished, in no acute distress, alert and oriented x3   Dermatology: Skin is warm, dry and supple bilateral. Left great toe medial border is tender with evidence of an ingrowing nail. Pain on palpation noted to the border of the nail fold. The remaining nails appear unremarkable at this time. There are no open sores, lesions.  Vascular: DP and PT pulses palpable.  No clinical evidence of vascular compromise  Neruologic: Grossly intact via light touch bilateral.  Musculoskeletal: No pedal deformity noted  Assesement: #1 Paronychia with ingrowing nail left great toe medial border  Plan of Care:  1. Patient evaluated.  2. Discussed treatment alternatives and plan of care. Explained nail avulsion procedure and post procedure course to patient. 3. Patient opted for permanent partial nail avulsion of the ingrown portion of the nail.  4. Prior to procedure, local anesthesia infiltration utilized using 3 ml of a 50:50 mixture of 2% plain lidocaine and 0.5% plain marcaine in a normal hallux block fashion and a betadine prep performed.  5. Partial permanent nail avulsion with chemical matrixectomy performed using 3x30sec applications of phenol followed by alcohol flush.  6. Light dressing applied.  Post care instructions provided 7.   Prescription for mupirocin 2% ointment 8.  Return to clinic 2 weeks.  Felecia Shelling, DPM [facility]  Dr. Felecia Shelling, DPM    [address] [place], [place] [address]                Office [phone]  Fax [phone]

## 2022-02-12 ENCOUNTER — Other Ambulatory Visit: Payer: Self-pay | Admitting: Internal Medicine

## 2022-02-13 NOTE — Telephone Encounter (Signed)
Rx was sent to pharmacy on 12/16/21 #90/0.  Pt will need OV for additional refills.   Requested Prescriptions  Pending Prescriptions Disp Refills   amLODipine (NORVASC) 10 MG tablet [Pharmacy Med Name: AMLODIPINE BESYLATE 10 MG TAB] 90 tablet 0    Sig: TAKE 1 TABLET BY MOUTH DAILY     Cardiovascular: Calcium Channel Blockers 2 Failed - 02/12/2022 10:28 AM      Failed - Last BP in normal range    BP Readings from Last 1 Encounters:  12/13/21 (!) 143/103         Failed - Valid encounter within last 6 months    Recent Outpatient Visits           1 year ago Essential hypertension   [facility], Salvadore Oxford, NP   1 year ago History of pulmonary embolus (PE)   [facility], Salvadore Oxford, NP   2 years ago Encounter to establish care with new doctor   [facility], Jodelle Gross, [place]              Passed - Last Heart Rate in normal range    Pulse Readings from Last 1 Encounters:  12/13/21 84

## 2022-06-16 ENCOUNTER — Other Ambulatory Visit: Payer: Self-pay | Admitting: Internal Medicine

## 2022-06-17 NOTE — Telephone Encounter (Signed)
Requested medication (s) are due for refill today: yes  Requested medication (s) are on the active medication list: yes  Last refill:  12/16/21 #90  Future visit scheduled: no  Notes to clinic:  called pt and LM on VM to call back to make appt for CPE   Requested Prescriptions  Pending Prescriptions Disp Refills   amLODipine (NORVASC) 10 MG tablet [Pharmacy Med Name: AMLODIPINE BESYLATE 10 MG TAB] 90 tablet 0    Sig: TAKE 1 TABLET BY MOUTH DAILY     Cardiovascular: Calcium Channel Blockers 2 Failed - 06/16/2022  2:11 PM      Failed - Last BP in normal range    BP Readings from Last 1 Encounters:  12/13/21 (!) 143/103         Failed - Valid encounter within last 6 months    Recent Outpatient Visits           1 year ago Essential hypertension   [place] [facility], Salvadore Oxford, NP   2 years ago History of pulmonary embolus (PE)   [place] [facility], Salvadore Oxford, NP   2 years ago Encounter to establish care with new doctor   [place] [facility] [place], Jodelle Gross, [place]              Passed - Last Heart Rate in normal range    Pulse Readings from Last 1 Encounters:  12/13/21 84
